# Patient Record
Sex: Male | Born: 1955 | Race: White | Hispanic: No | Marital: Married | Smoking: Former smoker
Health system: Southern US, Community
[De-identification: ages and names within clinical notes are randomized; demographics above are authoritative.]

## PROBLEM LIST (undated history)

## (undated) DIAGNOSIS — E785 Hyperlipidemia, unspecified: Secondary | ICD-10-CM

## (undated) DIAGNOSIS — K5730 Diverticulosis of large intestine without perforation or abscess without bleeding: Secondary | ICD-10-CM

## (undated) DIAGNOSIS — R252 Cramp and spasm: Secondary | ICD-10-CM

## (undated) HISTORY — PX: VASECTOMY: SHX75

## (undated) HISTORY — PX: COLOSTOMY: SHX63

## (undated) HISTORY — DX: Cramp and spasm: R25.2

## (undated) HISTORY — DX: Diverticulosis of large intestine without perforation or abscess without bleeding: K57.30

## (undated) HISTORY — DX: Hyperlipidemia, unspecified: E78.5

## (undated) HISTORY — PX: OTHER SURGICAL HISTORY: SHX169

---

## 2005-04-03 ENCOUNTER — Ambulatory Visit: Payer: Self-pay | Admitting: Internal Medicine

## 2005-04-04 ENCOUNTER — Ambulatory Visit: Payer: Self-pay | Admitting: Internal Medicine

## 2009-07-22 ENCOUNTER — Ambulatory Visit: Payer: Self-pay | Admitting: Internal Medicine

## 2009-08-04 ENCOUNTER — Ambulatory Visit: Payer: Self-pay | Admitting: Internal Medicine

## 2009-08-16 ENCOUNTER — Ambulatory Visit: Payer: Self-pay | Admitting: Internal Medicine

## 2009-08-17 LAB — CONVERTED CEMR LAB
AST: 29 units/L (ref 0–37)
Albumin: 4.3 g/dL (ref 3.5–5.2)
Alkaline Phosphatase: 55 units/L (ref 39–117)
Basophils Absolute: 0 10*3/uL (ref 0.0–0.1)
Bilirubin Urine: NEGATIVE
Bilirubin, Direct: 0.1 mg/dL (ref 0.0–0.3)
Calcium: 9.2 mg/dL (ref 8.4–10.5)
Eosinophils Absolute: 0.1 10*3/uL (ref 0.0–0.7)
Eosinophils Relative: 2.6 % (ref 0.0–5.0)
GFR calc non Af Amer: 74.49 mL/min (ref >60)
Glucose, Bld: 115 mg/dL — ABNORMAL HIGH (ref 70–99)
HCT: 40.8 % (ref 39.0–52.0)
HDL: 83.8 mg/dL (ref >39.00)
Hemoglobin, Urine: NEGATIVE
Lymphs Abs: 2.2 10*3/uL (ref 0.7–4.0)
MCHC: 34.9 g/dL (ref 30.0–36.0)
MCV: 92.3 fL (ref 78.0–100.0)
Monocytes Absolute: 0.5 10*3/uL (ref 0.1–1.0)
Neutrophils Relative %: 43.6 % (ref 43.0–77.0)
Nitrite: NEGATIVE
PSA: 0.76 ng/mL (ref 0.10–4.00)
Platelets: 237 10*3/uL (ref 150.0–400.0)
Potassium: 4.3 meq/L (ref 3.5–5.1)
RDW: 12.1 % (ref 11.5–14.6)
Sodium: 140 meq/L (ref 135–145)
Total Bilirubin: 1 mg/dL (ref 0.3–1.2)
Total Protein, Urine: NEGATIVE mg/dL
Triglycerides: 48 mg/dL (ref 0.0–149.0)
Urobilinogen, UA: 0.2 (ref 0.0–1.0)
VLDL: 9.6 mg/dL (ref 0.0–40.0)
WBC: 4.9 10*3/uL (ref 4.5–10.5)

## 2009-08-19 ENCOUNTER — Ambulatory Visit: Payer: Self-pay | Admitting: Internal Medicine

## 2009-08-19 DIAGNOSIS — R252 Cramp and spasm: Secondary | ICD-10-CM

## 2009-08-19 DIAGNOSIS — E785 Hyperlipidemia, unspecified: Secondary | ICD-10-CM | POA: Insufficient documentation

## 2009-08-19 DIAGNOSIS — K5730 Diverticulosis of large intestine without perforation or abscess without bleeding: Secondary | ICD-10-CM

## 2009-08-19 HISTORY — DX: Diverticulosis of large intestine without perforation or abscess without bleeding: K57.30

## 2009-08-19 HISTORY — DX: Cramp and spasm: R25.2

## 2009-08-19 HISTORY — DX: Hyperlipidemia, unspecified: E78.5

## 2011-11-30 ENCOUNTER — Telehealth: Payer: Self-pay

## 2011-11-30 DIAGNOSIS — Z1289 Encounter for screening for malignant neoplasm of other sites: Secondary | ICD-10-CM

## 2011-11-30 DIAGNOSIS — Z0000 Encounter for general adult medical examination without abnormal findings: Secondary | ICD-10-CM

## 2011-11-30 NOTE — Telephone Encounter (Signed)
Put order in for physical labs. 

## 2011-12-16 ENCOUNTER — Encounter: Payer: Self-pay | Admitting: Internal Medicine

## 2011-12-16 DIAGNOSIS — Z0000 Encounter for general adult medical examination without abnormal findings: Secondary | ICD-10-CM | POA: Insufficient documentation

## 2011-12-18 ENCOUNTER — Other Ambulatory Visit (INDEPENDENT_AMBULATORY_CARE_PROVIDER_SITE_OTHER): Payer: Self-pay

## 2011-12-18 DIAGNOSIS — Z1289 Encounter for screening for malignant neoplasm of other sites: Secondary | ICD-10-CM

## 2011-12-18 DIAGNOSIS — Z0000 Encounter for general adult medical examination without abnormal findings: Secondary | ICD-10-CM

## 2011-12-18 LAB — BASIC METABOLIC PANEL
BUN: 13 mg/dL (ref 6–23)
CO2: 29 mEq/L (ref 19–32)
Chloride: 99 mEq/L (ref 96–112)
Glucose, Bld: 98 mg/dL (ref 70–99)
Potassium: 4.3 mEq/L (ref 3.5–5.1)

## 2011-12-18 LAB — CBC WITH DIFFERENTIAL/PLATELET
Basophils Relative: 0.6 % (ref 0.0–3.0)
Eosinophils Relative: 3 % (ref 0.0–5.0)
Lymphocytes Relative: 48.3 % — ABNORMAL HIGH (ref 12.0–46.0)
MCV: 90.7 fl (ref 78.0–100.0)
Monocytes Absolute: 0.4 10*3/uL (ref 0.1–1.0)
Monocytes Relative: 13.1 % — ABNORMAL HIGH (ref 3.0–12.0)
Neutrophils Relative %: 35 % — ABNORMAL LOW (ref 43.0–77.0)
RBC: 4.33 Mil/uL (ref 4.22–5.81)
WBC: 2.9 10*3/uL — ABNORMAL LOW (ref 4.5–10.5)

## 2011-12-18 LAB — PSA: PSA: 0.91 ng/mL (ref 0.10–4.00)

## 2011-12-18 LAB — TSH: TSH: 1.3 u[IU]/mL (ref 0.35–5.50)

## 2011-12-18 LAB — HEPATIC FUNCTION PANEL
ALT: 31 U/L (ref 0–53)
AST: 38 U/L — ABNORMAL HIGH (ref 0–37)
Albumin: 3.8 g/dL (ref 3.5–5.2)
Total Protein: 6.4 g/dL (ref 6.0–8.3)

## 2011-12-18 LAB — URINALYSIS, ROUTINE W REFLEX MICROSCOPIC
Bilirubin Urine: NEGATIVE
Ketones, ur: NEGATIVE
Leukocytes, UA: NEGATIVE
Urine Glucose: NEGATIVE
Urobilinogen, UA: 0.2 (ref 0.0–1.0)

## 2011-12-18 LAB — LIPID PANEL
LDL Cholesterol: 88 mg/dL (ref 0–99)
Total CHOL/HDL Ratio: 3

## 2011-12-19 ENCOUNTER — Encounter: Payer: Self-pay | Admitting: Internal Medicine

## 2011-12-21 ENCOUNTER — Encounter: Payer: Self-pay | Admitting: Internal Medicine

## 2011-12-21 ENCOUNTER — Ambulatory Visit (INDEPENDENT_AMBULATORY_CARE_PROVIDER_SITE_OTHER): Payer: BC Managed Care – PPO | Admitting: Internal Medicine

## 2011-12-21 VITALS — BP 112/70 | HR 62 | Temp 97.4°F | Ht 69.0 in | Wt 159.1 lb

## 2011-12-21 DIAGNOSIS — R252 Cramp and spasm: Secondary | ICD-10-CM

## 2011-12-21 DIAGNOSIS — G4700 Insomnia, unspecified: Secondary | ICD-10-CM | POA: Insufficient documentation

## 2011-12-21 DIAGNOSIS — Z0000 Encounter for general adult medical examination without abnormal findings: Secondary | ICD-10-CM

## 2011-12-21 DIAGNOSIS — Z23 Encounter for immunization: Secondary | ICD-10-CM

## 2011-12-21 MED ORDER — ZALEPLON 5 MG PO CAPS: 5.0000 mg | ORAL_CAPSULE | Freq: Every day | ORAL | Status: AC

## 2011-12-21 MED ORDER — ASPIRIN 81 MG PO TBEC: 81.0000 mg | DELAYED_RELEASE_TABLET | Freq: Every day | ORAL | Status: AC

## 2011-12-21 MED ORDER — METHOCARBAMOL 500 MG PO TABS: 500.0000 mg | ORAL_TABLET | Freq: Four times a day (QID) | ORAL | Status: AC | PRN

## 2011-12-21 NOTE — Progress Notes (Signed)
Subjective:    Patient ID: Micheal Guerrero, male    DOB: 03/07/1956, 56 y.o.   MRN: [id number]  HPI Here for wellness and f/u;  Overall doing ok;  Pt denies CP, worsening SOB, DOE, wheezing, orthopnea, PND, worsening LE edema, palpitations, dizziness or syncope.  Pt denies neurological change such as new Headache, facial or extremity weakness.  Pt denies polydipsia, polyuria, or low sugar symptoms. Pt states overall good compliance with treatment and medications, good tolerability, and trying to follow lower cholesterol diet.  Pt denies worsening depressive symptoms, suicidal ideation or panic. No fever, wt loss, night sweats, loss of appetite, or other constitutional symptoms.  Pt states good ability with ADL's, low fall risk, home safety reviewed and adequate, no significant changes in hearing or vision, and occasionally active with exercise.  Overall under quite a bit stress with marriage, occas musclular cramps with running for exercise, and tends to wake up at 2am some nights, and wife's sonata 5 mg has helped (ambien caused hangover). Past Medical History  Diagnosis Date  . DIVERTICULOSIS, COLON 08/19/2009  . HYPERLIPIDEMIA 08/19/2009  . LEG CRAMPS 08/19/2009   Past Surgical History  Procedure Date  . S/p bakers cyst knee      @ age 72  . Vasectomy     reports that he has quit smoking. He does not have any smokeless tobacco history on file. He reports that he drinks alcohol. He reports that he uses illicit drugs. family history includes Dementia in his father. No Known Allergies No current outpatient prescriptions on file prior to visit.    Review of Systems Review of Systems  Constitutional: Negative for diaphoresis, activity change, appetite change and unexpected weight change.  HENT: Negative for hearing loss, ear pain, facial swelling, mouth sores and neck stiffness.   Eyes: Negative for pain, redness and visual disturbance.  Respiratory: Negative for shortness of breath and  wheezing.   Cardiovascular: Negative for chest pain and palpitations.  Gastrointestinal: Negative for diarrhea, blood in stool, abdominal distention and rectal pain.  Genitourinary: Negative for hematuria, flank pain and decreased urine volume.  Musculoskeletal: Negative for myalgias and joint swelling.  Skin: Negative for color change and wound.  Neurological: Negative for syncope and numbness.  Hematological: Negative for adenopathy.  Psychiatric/Behavioral: Negative for hallucinations, self-injury, decreased concentration and agitation.      Objective:   Physical Exam BP 112/70  Pulse 62  Temp(Src) 97.4 F (36.3 C) (Oral)  Ht 5\' 9"  (1.753 m)  Wt 159 lb 2 oz (72.179 kg)  BMI 23.50 kg/m2  SpO2 96%  Physical Exam  VS noted Constitutional: Pt is oriented to person, place, and time. Appears well-developed and well-nourished.  HENT:  Head: Normocephalic and atraumatic.  Right Ear: External ear normal.  Left Ear: External ear normal.  Nose: Nose normal.  Mouth/Throat: Oropharynx is clear and moist.  Eyes: Conjunctivae and EOM are normal. Pupils are equal, round, and reactive to light.  Neck: Normal range of motion. Neck supple. No JVD present. No tracheal deviation present.  Cardiovascular: Normal rate, regular rhythm, normal heart sounds and intact distal pulses.   Pulmonary/Chest: Effort normal and breath sounds normal.  Abdominal: Soft. Bowel sounds are normal. There is no tenderness.  Musculoskeletal: Normal range of motion. Exhibits no edema.  Lymphadenopathy:  Has no cervical adenopathy.  Neurological: Pt is alert and oriented to person, place, and time. Pt has normal reflexes. No cranial nerve deficit.  Skin: Skin is warm and dry. No rash noted.  Psychiatric:  Has  normal mood and affect. Behavior is normal.     Assessment & Plan:

## 2011-12-21 NOTE — Progress Notes (Signed)
Addended by: Scharlene Gloss B on: 12/21/2011 09:55 AM   Modules accepted: Orders

## 2011-12-21 NOTE — Assessment & Plan Note (Addendum)
Overall doing well, age appropriate education and counseling updated, referrals for preventative services and immunizations addressed, dietary and smoking counseling addressed, most recent labs and ECG reviewed.  I have personally reviewed and have noted: 1) the patient's medical and social history 2) The pt's use of alcohol, tobacco, and illicit drugs 3) The patient's current medications and supplements 4) Functional ability including ADL's, fall risk, home safety risk, hearing and visual impairment 5) Diet and physical activities 6) Evidence for depression or mood disorder 7) The patient's height, weight, and BMI have been recorded in the chart I have made referrals, and provided counseling and education based on review of the above Also with internat travel - asks for Hep A and B immnizations - to start the series today ECG reviewed as per emr

## 2011-12-21 NOTE — Patient Instructions (Addendum)
You had the Twinrx shot today (first shot for Hep A/Hep B) Please make Nurse Appt for Shot #2 in 2 months, and Shot #3 in 6 months You had the tetanus shot today Take all new medications as prescribed  Continue all other medications as before Please also take Aspirin 81 mg - 1 per day - COATED only - to reduce risk of stroke and heart disease You are otherwise up to date with prevention at this time Please return in 1 year for your yearly visit, or sooner if needed, with Lab testing done 3-5 days before

## 2012-02-19 ENCOUNTER — Ambulatory Visit (INDEPENDENT_AMBULATORY_CARE_PROVIDER_SITE_OTHER): Payer: BC Managed Care – PPO

## 2012-02-19 DIAGNOSIS — Z23 Encounter for immunization: Secondary | ICD-10-CM

## 2012-05-18 ENCOUNTER — Encounter: Payer: Self-pay | Admitting: Internal Medicine

## 2012-05-18 ENCOUNTER — Ambulatory Visit: Payer: BC Managed Care – PPO | Admitting: Internal Medicine

## 2012-05-18 VITALS — BP 106/68 | HR 91 | Temp 98.4°F | Resp 16

## 2012-05-18 DIAGNOSIS — T148XXA Other injury of unspecified body region, initial encounter: Secondary | ICD-10-CM

## 2012-05-18 DIAGNOSIS — W540XXA Bitten by dog, initial encounter: Secondary | ICD-10-CM

## 2012-05-18 DIAGNOSIS — S61409A Unspecified open wound of unspecified hand, initial encounter: Secondary | ICD-10-CM

## 2012-05-18 MED ORDER — AMOXICILLIN-POT CLAVULANATE 875-125 MG PO TABS: 1.0000 | ORAL_TABLET | Freq: Two times a day (BID) | ORAL | Status: AC

## 2012-05-18 NOTE — Progress Notes (Signed)
  Subjective:    Patient ID: Micheal Guerrero, male    DOB: Apr 19, 1956, 56 y.o.   MRN: [id number]  HPIHis dog bit him on the hand as he was trying to carry him away from the neighbors house Dog shots are all up to date bite over radial artery but history suggests no arterial bleeding    Review of Systems     Objective:   Physical Exam  L hand with bite wounds volar wrist and dorsum(2 small) Puncture over rad art but vascular/neuro intact and no pain with tendon stressors      Assessment & Plan:  Bite wound- Augmentin prophylaxis for 5 days Wound care Suture removal 7 d

## 2012-05-18 NOTE — Patient Instructions (Addendum)

## 2012-05-18 NOTE — Progress Notes (Signed)
@  [email or web address]  Patient ID: Micheal Guerrero MRN: [id number], DOB: October 14, 1956, 56 y.o. Date of Encounter: 05/18/2012, 3:04 PM   PROCEDURE NOTE: Verbal consent obtained. Sterile technique employed. Numbing: Local anesthesia obtained with 2% lidocaine plain  Cleansed with soap and water. Irrigated.  Wound explored, no deep structures involved, no foreign bodies.   Wound repaired with # 2 SI and #1 HM sutures with 4.0 ethilon Hemostasis obtained. Wound cleansed and dressed.  Wound care instructions including precautions covered with patient. Handout given.  Anticipate suture removal in 7-10 days  Rhoderick Moody, PA-C 05/18/2012 3:04 PM

## 2012-05-27 ENCOUNTER — Ambulatory Visit (INDEPENDENT_AMBULATORY_CARE_PROVIDER_SITE_OTHER): Payer: BC Managed Care – PPO | Admitting: Physician Assistant

## 2012-05-27 VITALS — BP 118/74 | HR 51 | Resp 16

## 2012-05-27 DIAGNOSIS — Z4802 Encounter for removal of sutures: Secondary | ICD-10-CM

## 2012-05-27 NOTE — Progress Notes (Signed)
   Patient ID: Micheal Guerrero MRN: [id number], DOB: Dec 23, 1955 56 y.o. Date of Encounter: 05/27/2012, 12:20 PM  Primary Physician: Oliver Barre, MD  Chief Complaint: Suture removal    See note from 05/18/12  HPI: 56 y.o. y/o male with injury to volar medial left wrist secondary to dog bite. Tolerating Augmentin. Here for suture removal s/p placement on 05/18/12 Doing well No issues/complaints Afebrile/ No chills No erythema No pain Able to move without difficulty Normal sensation  Past Medical History  Diagnosis Date  . DIVERTICULOSIS, COLON 08/19/2009  . HYPERLIPIDEMIA 08/19/2009  . LEG CRAMPS 08/19/2009     Home Meds: Prior to Admission medications   Medication Sig Start Date End Date Taking? Authorizing Provider  amoxicillin-clavulanate (AUGMENTIN) 875-125 MG per tablet Take 1 tablet by mouth 2 (two) times daily. With food 05/18/12 05/28/12  Tonye Pearson, MD  aspirin 81 MG EC tablet Take 1 tablet (81 mg total) by mouth daily. Swallow whole. 12/21/11 12/20/12  Corwin Levins, MD    Allergies: No Known Allergies  Physical Exam: Blood pressure 118/74, pulse 51, resp. rate 16., There is no height or weight on file to calculate BMI. General: Well developed, well nourished, in no acute distress. Head: Normocephalic, atraumatic, sclera non-icteric, no xanthomas, nares are without discharge.  Neck: Supple. Lungs: Breathing is unlabored. Heart: Normal rate. Msk:  Strength and tone appear normal for age. Wound: Wound well healed without erythema, swelling, or tenderness to palpation. FROM and 5/5 strength with normal sensation throughout including 2 point discrimination Skin: See above, otherwise dry without rash or erythema. Extremities: No clubbing or cyanosis. No edema. Neuro: Alert and oriented X 3. Moves all extremities spontaneously.  Psych:  Responds to questions appropriately with a normal affect.   PROCEDURE: Verbal consent obtained. 2 simple interrupted and 1  horizontal mattress sutures removed without difficulty.  Assessment and Plan: 56 y.o. y/o male here for suture removal for wound described above. -No signs of infection -Sutures removed per above -Wound resolved -Finish Augmentin -RTC prn  Signed, Eula Listen, PA-C 05/27/2012 12:20 PM

## 2012-06-19 ENCOUNTER — Ambulatory Visit: Payer: BC Managed Care – PPO

## 2012-07-09 ENCOUNTER — Ambulatory Visit (INDEPENDENT_AMBULATORY_CARE_PROVIDER_SITE_OTHER): Payer: BC Managed Care – PPO

## 2012-07-09 DIAGNOSIS — Z23 Encounter for immunization: Secondary | ICD-10-CM

## 2015-05-07 ENCOUNTER — Encounter: Payer: Self-pay | Admitting: Internal Medicine

## 2015-07-15 ENCOUNTER — Encounter: Payer: Self-pay | Admitting: Internal Medicine

## 2015-07-15 ENCOUNTER — Other Ambulatory Visit (INDEPENDENT_AMBULATORY_CARE_PROVIDER_SITE_OTHER): Payer: BLUE CROSS/BLUE SHIELD

## 2015-07-15 ENCOUNTER — Ambulatory Visit (INDEPENDENT_AMBULATORY_CARE_PROVIDER_SITE_OTHER): Payer: BLUE CROSS/BLUE SHIELD | Admitting: Internal Medicine

## 2015-07-15 VITALS — BP 118/76 | HR 71 | Temp 99.4°F | Ht 69.0 in | Wt 165.0 lb

## 2015-07-15 DIAGNOSIS — M79671 Pain in right foot: Secondary | ICD-10-CM

## 2015-07-15 DIAGNOSIS — M79672 Pain in left foot: Secondary | ICD-10-CM | POA: Diagnosis not present

## 2015-07-15 DIAGNOSIS — Z0000 Encounter for general adult medical examination without abnormal findings: Secondary | ICD-10-CM

## 2015-07-15 LAB — URINALYSIS, ROUTINE W REFLEX MICROSCOPIC
Bilirubin Urine: NEGATIVE
Ketones, ur: NEGATIVE
Leukocytes, UA: NEGATIVE
NITRITE: NEGATIVE
Specific Gravity, Urine: 1.025 (ref 1.000–1.030)
Total Protein, Urine: NEGATIVE
URINE GLUCOSE: NEGATIVE
UROBILINOGEN UA: 0.2 (ref 0.0–1.0)
WBC, UA: NONE SEEN (ref 0–?)
pH: 5.5 (ref 5.0–8.0)

## 2015-07-15 LAB — CBC WITH DIFFERENTIAL/PLATELET
Basophils Absolute: 0 10*3/uL (ref 0.0–0.1)
Basophils Relative: 0.6 % (ref 0.0–3.0)
EOS PCT: 1.9 % (ref 0.0–5.0)
Eosinophils Absolute: 0.1 10*3/uL (ref 0.0–0.7)
HCT: 41 % (ref 39.0–52.0)
HEMOGLOBIN: 14.1 g/dL (ref 13.0–17.0)
LYMPHS PCT: 24.5 % (ref 12.0–46.0)
Lymphs Abs: 1.5 10*3/uL (ref 0.7–4.0)
MCHC: 34.4 g/dL (ref 30.0–36.0)
MCV: 91.8 fl (ref 78.0–100.0)
Monocytes Absolute: 0.6 10*3/uL (ref 0.1–1.0)
Monocytes Relative: 10.2 % (ref 3.0–12.0)
Neutro Abs: 3.8 10*3/uL (ref 1.4–7.7)
Neutrophils Relative %: 62.8 % (ref 43.0–77.0)
Platelets: 219 10*3/uL (ref 150.0–400.0)
RBC: 4.46 Mil/uL (ref 4.22–5.81)
RDW: 12.5 % (ref 11.5–15.5)
WBC: 6 10*3/uL (ref 4.0–10.5)

## 2015-07-15 LAB — LIPID PANEL
CHOL/HDL RATIO: 3
CHOLESTEROL: 202 mg/dL — AB (ref 0–200)
HDL: 63.7 mg/dL (ref >39.00)
LDL CALC: 123 mg/dL — AB (ref 0–99)
NonHDL: 138.14
TRIGLYCERIDES: 74 mg/dL (ref 0.0–149.0)
VLDL: 14.8 mg/dL (ref 0.0–40.0)

## 2015-07-15 LAB — BASIC METABOLIC PANEL
BUN: 16 mg/dL (ref 6–23)
CO2: 29 meq/L (ref 19–32)
Calcium: 9.3 mg/dL (ref 8.4–10.5)
Chloride: 102 mEq/L (ref 96–112)
Creatinine, Ser: 0.98 mg/dL (ref 0.40–1.50)
GFR: 83.3 mL/min (ref >60.00)
GLUCOSE: 100 mg/dL — AB (ref 70–99)
POTASSIUM: 4.3 meq/L (ref 3.5–5.1)
SODIUM: 139 meq/L (ref 135–145)

## 2015-07-15 LAB — HEPATIC FUNCTION PANEL
ALK PHOS: 62 U/L (ref 39–117)
ALT: 21 U/L (ref 0–53)
AST: 21 U/L (ref 0–37)
Albumin: 4.3 g/dL (ref 3.5–5.2)
BILIRUBIN TOTAL: 0.7 mg/dL (ref 0.2–1.2)
Bilirubin, Direct: 0.1 mg/dL (ref 0.0–0.3)
Total Protein: 7.1 g/dL (ref 6.0–8.3)

## 2015-07-15 LAB — PSA: PSA: 1.18 ng/mL (ref 0.10–4.00)

## 2015-07-15 LAB — TSH: TSH: 1.26 u[IU]/mL (ref 0.35–4.50)

## 2015-07-15 MED ORDER — ASPIRIN EC 81 MG PO TBEC: 81.0000 mg | DELAYED_RELEASE_TABLET | Freq: Every day | ORAL | Status: DC

## 2015-07-15 MED ORDER — MELOXICAM 15 MG PO TABS: 15.0000 mg | ORAL_TABLET | Freq: Every day | ORAL | Status: DC

## 2015-07-15 NOTE — Progress Notes (Signed)
Pre visit review using our clinic review tool, if applicable. No additional management support is needed unless otherwise documented below in the visit note. 

## 2015-07-15 NOTE — Assessment & Plan Note (Signed)
[facility] for nsaid prn otc, but also to see Dr Smith/sport med -

## 2015-07-15 NOTE — Patient Instructions (Addendum)
Your EKG was OK today  Please start Aspirin (enteric coated only) 81 mg per day (OTC) for general risk reduction for heart disease prevention  Please take all new medication as prescribed - the anti-inflammatory if needed  You will be contacted regarding the referral for: Dr Smith/Sports Medicine in this office  Please continue all other medications as before, and refills have been done if requested.  Please have the pharmacy call with any other refills you may need.  Please continue your efforts at being more active, low cholesterol diet, and weight control.  You are otherwise up to date with prevention measures today.  Please keep your appointments with your specialists as you may have planned  Please go to the LAB in the Basement (turn left off the elevator) for the tests to be done today  You will be contacted by phone if any changes need to be made immediately.  Otherwise, you will receive a letter about your results with an explanation, but please check with MyChart first.  Please remember to sign up for MyChart if you have not done so, as this will be important to you in the future with finding out test results, communicating by private email, and scheduling acute appointments online when needed.  Please return in 1 year for your yearly visit, or sooner if needed, with Lab testing done 3-5 days before

## 2015-07-15 NOTE — Assessment & Plan Note (Addendum)

## 2015-07-15 NOTE — Addendum Note (Signed)
Addended by: Biagio Borg on: 07/15/2015 12:40 PM   Modules accepted: Miquel Dunn

## 2015-07-15 NOTE — Progress Notes (Signed)
Subjective:    Patient ID: Micheal Guerrero, male    DOB: 04/21/1956, 59 y.o.   MRN: [id number]  HPI Here for wellness and f/u;  Overall doing ok;  Pt denies Chest pain, worsening SOB, DOE, wheezing, orthopnea, PND, worsening LE edema, palpitations, dizziness or syncope.  Pt denies neurological change such as new headache, facial or extremity weakness.  Pt denies polydipsia, polyuria, or low sugar symptoms. Pt states overall good compliance with treatment and medications, good tolerability, and has been trying to follow appropriate diet.  Pt denies worsening depressive symptoms, suicidal ideation or panic. No fever, night sweats, wt loss, loss of appetite, or other constitutional symptoms.  Pt states good ability with ADL's, has low fall risk, home safety reviewed and adequate, no other significant changes in hearing or vision, and only occasionally active with exercise.  Does have bilat distal foot pad pain with cycling Past Medical History  Diagnosis Date  . DIVERTICULOSIS, COLON 08/19/2009  . HYPERLIPIDEMIA 08/19/2009  . LEG CRAMPS 08/19/2009   Past Surgical History  Procedure Laterality Date  . S/p bakers cyst knee       @ age 52  . Vasectomy      reports that he has quit smoking. He does not have any smokeless tobacco history on file. He reports that he drinks alcohol. He reports that he uses illicit drugs. family history includes Dementia in his father. No Known Allergies No current outpatient prescriptions on file prior to visit.   No current facility-administered medications on file prior to visit.   Review of Systems Constitutional: Negative for increased diaphoresis, other activity, appetite or siginficant weight change other than noted HENT: Negative for worsening hearing loss, ear pain, facial swelling, mouth sores and neck stiffness.   Eyes: Negative for other worsening pain, redness or visual disturbance.  Respiratory: Negative for shortness of breath and wheezing    Cardiovascular: Negative for chest pain and palpitations.  Gastrointestinal: Negative for diarrhea, blood in stool, abdominal distention or other pain Genitourinary: Negative for hematuria, flank pain or change in urine volume.  Musculoskeletal: Negative for myalgias or other joint complaints.  Skin: Negative for color change and wound or drainage.  Neurological: Negative for syncope and numbness. other than noted Hematological: Negative for adenopathy. or other swelling Psychiatric/Behavioral: Negative for hallucinations, SI, self-injury, decreased concentration or other worsening agitation.      Objective:   Physical Exam BP 118/76 mmHg  Pulse 71  Temp(Src) 99.4 F (37.4 C) (Oral)  Ht 5\' 9"  (1.753 m)  Wt 165 lb (74.844 kg)  BMI 24.36 kg/m2  SpO2 97% VS noted,  Constitutional: Pt is oriented to person, place, and time. Appears well-developed and well-nourished, in no significant distress Head: Normocephalic and atraumatic.  Right Ear: External ear normal.  Left Ear: External ear normal.  Nose: Nose normal.  Mouth/Throat: Oropharynx is clear and moist.  Eyes: Conjunctivae and EOM are normal. Pupils are equal, round, and reactive to light.  Neck: Normal range of motion. Neck supple. No JVD present. No tracheal deviation present or significant neck LA or mass Cardiovascular: Normal rate, regular rhythm, normal heart sounds and intact distal pulses.   Pulmonary/Chest: Effort normal and breath sounds without rales or wheezing  Abdominal: Soft. Bowel sounds are normal. NT. No HSM  Musculoskeletal: Normal range of motion. Exhibits no edema.  Lymphadenopathy:  Has no cervical adenopathy.  Neurological: Pt is alert and oriented to person, place, and time. Pt has normal reflexes. No cranial nerve deficit. Motor  grossly intact Skin: Skin is warm and dry. No rash noted.  Bilat feet with tender sesamoid areas and callous Psychiatric:  Has normal mood and affect. Behavior is normal.      Assessment & Plan:

## 2015-07-19 ENCOUNTER — Ambulatory Visit (INDEPENDENT_AMBULATORY_CARE_PROVIDER_SITE_OTHER): Payer: BLUE CROSS/BLUE SHIELD | Admitting: Family Medicine

## 2015-07-19 ENCOUNTER — Other Ambulatory Visit (INDEPENDENT_AMBULATORY_CARE_PROVIDER_SITE_OTHER): Payer: BLUE CROSS/BLUE SHIELD

## 2015-07-19 ENCOUNTER — Encounter: Payer: Self-pay | Admitting: Family Medicine

## 2015-07-19 VITALS — BP 130/70 | HR 53 | Ht 69.0 in | Wt 164.0 lb

## 2015-07-19 DIAGNOSIS — M778 Other enthesopathies, not elsewhere classified: Secondary | ICD-10-CM | POA: Insufficient documentation

## 2015-07-19 DIAGNOSIS — M779 Enthesopathy, unspecified: Secondary | ICD-10-CM

## 2015-07-19 DIAGNOSIS — M79672 Pain in left foot: Secondary | ICD-10-CM

## 2015-07-19 DIAGNOSIS — M216X2 Other acquired deformities of left foot: Secondary | ICD-10-CM

## 2015-07-19 DIAGNOSIS — M7752 Other enthesopathy of left foot: Secondary | ICD-10-CM | POA: Diagnosis not present

## 2015-07-19 NOTE — Progress Notes (Signed)
Pre visit review using our clinic review tool, if applicable. No additional management support is needed unless otherwise documented below in the visit note. 

## 2015-07-19 NOTE — Patient Instructions (Addendum)
Good to see you.  Ice bath for 10-20 minutes after activity Exercises 3 times a week.  Avoid being barefoot.  Vitamin D 2000 IU daily pennsaid pinkie amount topically 2 times daily as needed.  Spenco orthotics look for total support can try at [employer] the metatarsal pad is what is important.  Skip the middle eye when lacing shoes.  See me again in 3-4 weeks.

## 2015-07-19 NOTE — Assessment & Plan Note (Signed)
Patient's loss of the transverse arch is causing patient to have more of a capsulitis of the toes. This in turn makes patient's numbness occur which then causes patient to change his gait causing a sesamoiditis. We discussed proper shoe wear, over-the-counter orthotics, different lacing, topical anti-inflammatory's, icing protocol, and diet changes. Patient will try to make these changes and come back and see me in 3 weeks. Continuing to have discomfort we may need to consider further imaging including an x-ray as well as potential injection. We'll discuss at follow-up. Patient does have a past medical history significant for a Baker cyst at age 31 and we may want to consider workup for autoimmune with a capsulitis if no significant improvement.

## 2015-07-19 NOTE — Progress Notes (Signed)
[facility] [place], [place] [address] Phone: [phone] Subjective:    I'm seeing this patient by the request  of:  Micheal Cower, MD   CC: left foot numbness with running.    CLE:[id number] PARRIS Micheal Guerrero is a 59 y.o. male coming in with complaint of left foot with numbness after running 8 miles or more.  Worse with trail running.  No injury.  No significant pain. Resolves with loose shoes.  No swelling.  Mild radiation of the feeling from lateral toes to large toe.the severity of 5 out of 10. Patient tries to remain active but has difficult to secondary to this. Denies any nighttime awakenings.  Past Medical History  Diagnosis Date  . DIVERTICULOSIS, COLON 08/19/2009  . HYPERLIPIDEMIA 08/19/2009  . LEG CRAMPS 08/19/2009   Past Surgical History  Procedure Laterality Date  . S/p bakers cyst knee       @ age 45  . Vasectomy     Social History  Substance Use Topics  . Smoking status: Former Research scientist (life sciences)  . Smokeless tobacco: None  . Alcohol Use: Yes     Comment: social   No Known Allergies        Family History  Problem Relation Age of Onset  . Dementia Father      Past medical history, social, surgical and family history all reviewed in electronic medical record.   Review of Systems: No headache, visual changes, nausea, vomiting, diarrhea, constipation, dizziness, abdominal pain, skin rash, fevers, chills, night sweats, weight loss, swollen lymph nodes, body aches, joint swelling, muscle aches, chest pain, shortness of breath, mood changes.   Objective Blood pressure 130/70, pulse 53, height 5\' 9"  (1.753 m), weight 164 lb (74.39 kg), SpO2 95 %.  General: No apparent distress alert and oriented x3 mood and affect normal, dressed appropriately.  HEENT: Pupils equal, extraocular movements intact  Respiratory: Patient's speak in full sentences and does not appear short of breath  Cardiovascular: No lower extremity edema, non  tender, no erythema  Skin: Warm dry intact with no signs of infection or rash on extremities or on axial skeleton.  Abdomen: Soft nontender  Neuro: Cranial nerves II through XII are intact, neurovascularly intact in all extremities with 2+ DTRs and 2+ pulses.  Lymph: No lymphadenopathy of posterior or anterior cervical chain or axillae bilaterally.  Gait normal with good balance and coordination.  MSK:  Non tender with full range of motion and good stability and symmetric strength and tone of shoulders, elbows, wrist, hip, knee and ankles bilaterally.  Foot exam showspatient does have a little bit of a pes cavus bilaterally left greater than right. Patient does have a Morton's toe bilaterally left greater than right. Mild hammering between the first and second toes. Callus formation noted over the plantar aspect of 2 through 4. Neutral hindfoot. Nontender on exam negative squeeze test neurovascularly intact distally.  Limited musculoskeletal ultrasound was performed and interpreted by Hulan Saas, M  Limited ultrasound the patient's forefoot shows the patient does have capsulitis between toes first, second and third MTP joints. Mild to moderate in amount of swelling. No neuroma noted. Patient though does have signs of hypoechoic changes on the plantar aspect over the sesamoid bone as well as intermetatarsal bursitis. Impression: Intermetatarsal bursitis and mild sesamoiditis with capsulitis of first through 3 MTP joints.  Procedure note [id number]; 15 minutes spent for Therapeutic exercises as stated in above notes.  This included exercises focusing on stretching,  strengthening, with significant focus on eccentric aspects.  Exercises for the foot include:  Stretches to help lengthen the lower leg and plantar fascia areas Theraband exercises for the lower leg and ankle to help strengthen the surrounding area- dorsiflexion, plantarflexion, inversion, eversion Massage rolling on the plantar surface of  the foot with a frozen bottle, tennis ball or golf ball Towel or marble pick-ups to strengthen the plantar surface of the foot Weight bearing exercises to increase balance and overall stability Proper technique shown and discussed handout in great detail with ATC.  All questions were discussed and answered.      Impression and Recommendations:     This case required medical decision making of moderate complexity.

## 2015-08-16 ENCOUNTER — Ambulatory Visit (INDEPENDENT_AMBULATORY_CARE_PROVIDER_SITE_OTHER): Payer: BLUE CROSS/BLUE SHIELD | Admitting: Family Medicine

## 2015-08-16 ENCOUNTER — Encounter: Payer: Self-pay | Admitting: Family Medicine

## 2015-08-16 VITALS — BP 122/70 | HR 61 | Ht 69.0 in | Wt 167.0 lb

## 2015-08-16 DIAGNOSIS — M7752 Other enthesopathy of left foot: Secondary | ICD-10-CM

## 2015-08-16 DIAGNOSIS — M778 Other enthesopathies, not elsewhere classified: Secondary | ICD-10-CM

## 2015-08-16 DIAGNOSIS — M779 Enthesopathy, unspecified: Principal | ICD-10-CM

## 2015-08-16 NOTE — Progress Notes (Signed)
Pre visit review using our clinic review tool, if applicable. No additional management support is needed unless otherwise documented below in the visit note. 

## 2015-08-16 NOTE — Assessment & Plan Note (Signed)
Patient is doing much better with conservative therapy. Encourage him to continue to wear good shoes as well as the orthotics that I think be beneficial. We discussed using icing and the topical anti-inflammatory or oral anti-inflammatory when needed. Lungs patient does well he can follow-up on an as-needed basis.

## 2015-08-16 NOTE — Progress Notes (Signed)
  [name] [address] [place], [place] [address] Phone: [phone] Subjective:    I'm seeing this patient by the request  of:  Cathlean Cower, MD   CC: left foot numbness with running.    [id number] Micheal Guerrero is a 59 y.o. male coming in with complaint of left foot with numbness after running 8 miles or more.  Patient did get over-the-counter orthotics, icing protocol, meloxicam, and did decrease some of his running. Patient states since then he is feeling 80-85% better. Denies any new symptoms. States that he is very happy with the results.  Past Medical History  Diagnosis Date  . DIVERTICULOSIS, COLON 08/19/2009  . HYPERLIPIDEMIA 08/19/2009  . LEG CRAMPS 08/19/2009   Past Surgical History  Procedure Laterality Date  . S/p bakers cyst knee       @ age 70  . Vasectomy     Social History  Substance Use Topics  . Smoking status: Former Research scientist (life sciences)  . Smokeless tobacco: None  . Alcohol Use: Yes     Comment: social   No Known Allergies        Family History  Problem Relation Age of Onset  . Dementia Father      Past medical history, social, surgical and family history all reviewed in electronic medical record.   Review of Systems: No headache, visual changes, nausea, vomiting, diarrhea, constipation, dizziness, abdominal pain, skin rash, fevers, chills, night sweats, weight loss, swollen lymph nodes, body aches, joint swelling, muscle aches, chest pain, shortness of breath, mood changes.   Objective Blood pressure 122/70, pulse 61, height 5\' 9"  (1.753 m), weight 167 lb (75.751 kg), SpO2 93 %.  General: No apparent distress alert and oriented x3 mood and affect normal, dressed appropriately.  HEENT: Pupils equal, extraocular movements intact  Respiratory: Patient's speak in full sentences and does not appear short of breath  Cardiovascular: No lower extremity edema, non tender, no erythema  Skin: Warm dry intact with no signs of  infection or rash on extremities or on axial skeleton.  Abdomen: Soft nontender  Neuro: Cranial nerves II through XII are intact, neurovascularly intact in all extremities with 2+ DTRs and 2+ pulses.  Lymph: No lymphadenopathy of posterior or anterior cervical chain or axillae bilaterally.  Gait normal with good balance and coordination.  MSK:  Non tender with full range of motion and good stability and symmetric strength and tone of shoulders, elbows, wrist, hip, knee and ankles bilaterally.  Foot exam showspatient does have a little bit of a pes cavus bilaterally left greater than right. Patient does have a Morton's toe bilaterally left greater than right. Mild hammering between the first and second toes. Callus is resolved Neutral hindfoot. Nontender on exam negative squeeze test neurovascularly intact distally.      Impression and Recommendations:     This case required medical decision making of moderate complexity.

## 2015-08-16 NOTE — Patient Instructions (Signed)
Verbal instructions given

## 2015-10-19 ENCOUNTER — Ambulatory Visit: Payer: BLUE CROSS/BLUE SHIELD | Admitting: Family Medicine

## 2016-03-02 ENCOUNTER — Encounter (INDEPENDENT_AMBULATORY_CARE_PROVIDER_SITE_OTHER): Payer: Self-pay

## 2016-03-16 ENCOUNTER — Encounter: Payer: Self-pay | Admitting: Internal Medicine

## 2017-04-24 ENCOUNTER — Other Ambulatory Visit: Payer: Self-pay | Admitting: Internal Medicine

## 2017-04-24 ENCOUNTER — Encounter: Payer: Self-pay | Admitting: Internal Medicine

## 2017-04-24 ENCOUNTER — Ambulatory Visit (INDEPENDENT_AMBULATORY_CARE_PROVIDER_SITE_OTHER): Payer: BLUE CROSS/BLUE SHIELD | Admitting: Internal Medicine

## 2017-04-24 ENCOUNTER — Other Ambulatory Visit (INDEPENDENT_AMBULATORY_CARE_PROVIDER_SITE_OTHER): Payer: BLUE CROSS/BLUE SHIELD

## 2017-04-24 VITALS — BP 150/100 | HR 63 | Ht 69.0 in | Wt 172.0 lb

## 2017-04-24 DIAGNOSIS — Z0000 Encounter for general adult medical examination without abnormal findings: Secondary | ICD-10-CM

## 2017-04-24 DIAGNOSIS — Z0001 Encounter for general adult medical examination with abnormal findings: Secondary | ICD-10-CM

## 2017-04-24 DIAGNOSIS — I10 Essential (primary) hypertension: Secondary | ICD-10-CM

## 2017-04-24 DIAGNOSIS — E785 Hyperlipidemia, unspecified: Secondary | ICD-10-CM

## 2017-04-24 LAB — BASIC METABOLIC PANEL
BUN: 16 mg/dL (ref 6–23)
CO2: 29 mEq/L (ref 19–32)
Calcium: 9.6 mg/dL (ref 8.4–10.5)
Chloride: 101 mEq/L (ref 96–112)
Creatinine, Ser: 0.98 mg/dL (ref 0.40–1.50)
GFR: 82.8 mL/min (ref >60.00)
GLUCOSE: 103 mg/dL — AB (ref 70–99)
POTASSIUM: 4.3 meq/L (ref 3.5–5.1)
SODIUM: 139 meq/L (ref 135–145)

## 2017-04-24 LAB — HEPATIC FUNCTION PANEL
ALBUMIN: 4.8 g/dL (ref 3.5–5.2)
ALK PHOS: 56 U/L (ref 39–117)
ALT: 31 U/L (ref 0–53)
AST: 24 U/L (ref 0–37)
Bilirubin, Direct: 0.1 mg/dL (ref 0.0–0.3)
Total Bilirubin: 0.8 mg/dL (ref 0.2–1.2)
Total Protein: 7.4 g/dL (ref 6.0–8.3)

## 2017-04-24 LAB — CBC WITH DIFFERENTIAL/PLATELET
Basophils Absolute: 0.1 10*3/uL (ref 0.0–0.1)
Basophils Relative: 0.9 % (ref 0.0–3.0)
Eosinophils Absolute: 0 10*3/uL (ref 0.0–0.7)
Eosinophils Relative: 0.9 % (ref 0.0–5.0)
HCT: 42.1 % (ref 39.0–52.0)
Hemoglobin: 14.7 g/dL (ref 13.0–17.0)
Lymphocytes Relative: 37.6 % (ref 12.0–46.0)
Lymphs Abs: 2 10*3/uL (ref 0.7–4.0)
MCHC: 34.8 g/dL (ref 30.0–36.0)
MCV: 91.4 fl (ref 78.0–100.0)
Monocytes Absolute: 0.4 10*3/uL (ref 0.1–1.0)
Monocytes Relative: 7.7 % (ref 3.0–12.0)
Neutro Abs: 2.9 10*3/uL (ref 1.4–7.7)
Neutrophils Relative %: 52.9 % (ref 43.0–77.0)
Platelets: 274 10*3/uL (ref 150.0–400.0)
RBC: 4.61 Mil/uL (ref 4.22–5.81)
RDW: 13.1 % (ref 11.5–15.5)
WBC: 5.4 10*3/uL (ref 4.0–10.5)

## 2017-04-24 LAB — URINALYSIS, ROUTINE W REFLEX MICROSCOPIC
Bilirubin Urine: NEGATIVE
Hgb urine dipstick: NEGATIVE
Ketones, ur: NEGATIVE
Leukocytes, UA: NEGATIVE
Nitrite: NEGATIVE
RBC / HPF: NONE SEEN
Specific Gravity, Urine: 1.02
Total Protein, Urine: NEGATIVE
Urine Glucose: NEGATIVE
Urobilinogen, UA: 0.2
WBC, UA: NONE SEEN
pH: 6 (ref 5.0–8.0)

## 2017-04-24 LAB — PSA: PSA: 3.15 ng/mL (ref 0.10–4.00)

## 2017-04-24 LAB — LIPID PANEL
CHOLESTEROL: 254 mg/dL — AB (ref 0–200)
HDL: 71 mg/dL (ref >39.00)
LDL Cholesterol: 162 mg/dL — ABNORMAL HIGH (ref 0–99)
NonHDL: 182.78
TRIGLYCERIDES: 103 mg/dL (ref 0.0–149.0)
Total CHOL/HDL Ratio: 4
VLDL: 20.6 mg/dL (ref 0.0–40.0)

## 2017-04-24 LAB — TSH: TSH: 1.58 u[IU]/mL (ref 0.35–4.50)

## 2017-04-24 MED ORDER — ZOSTER VAC RECOMB ADJUVANTED 50 MCG/0.5ML IM SUSR: 0.5000 mL | Freq: Once | INTRAMUSCULAR | 1 refills | Status: AC

## 2017-04-24 NOTE — Progress Notes (Signed)
Subjective:    Patient ID: Micheal Guerrero, male    DOB: 1956/03/20, 61 y.o.   MRN: [id number]  HPI  Here for wellness and f/u;  Overall doing ok;  Pt denies Chest pain, worsening SOB, DOE, wheezing, orthopnea, PND, worsening LE edema, palpitations, dizziness or syncope.  Pt denies neurological change such as new headache, facial or extremity weakness.  Pt denies polydipsia, polyuria, or low sugar symptoms. Pt states overall good compliance with treatment and medications, good tolerability, and has been trying to follow appropriate diet.  Pt denies worsening depressive symptoms, suicidal ideation or panic. No fever, night sweats, wt loss, loss of appetite, or other constitutional symptoms.  Pt states good ability with ADL's, has low fall risk, home safety reviewed and adequate, no other significant changes in hearing or vision, and only occasionally active with exercise in last 6 mo with some wt gain, though before has done frequent running.  Bp normal at drug store 6 mo ago, and states will check BP more often at home.  BP Readings from Last 3 Encounters:  04/24/17 (!) 150/100  08/16/15 122/70  07/19/15 130/70   Wt Readings from Last 3 Encounters:  04/24/17 172 lb (78 kg)  08/16/15 167 lb (75.8 kg)  07/19/15 164 lb (74.4 kg)  Having some marital discord, has been to couples counselig, so more stress recently. Asks for counseling for personal.   Past Medical History:  Diagnosis Date  . DIVERTICULOSIS, COLON 08/19/2009  . HYPERLIPIDEMIA 08/19/2009  . LEG CRAMPS 08/19/2009   Past Surgical History:  Procedure Laterality Date  . s/p bakers cyst knee      @ age 23  . VASECTOMY      reports that he has quit smoking. He has never used smokeless tobacco. He reports that he drinks alcohol. He reports that he uses drugs. family history includes Dementia in his father. No Known Allergies No current outpatient prescriptions on file prior to visit.   No current facility-administered  medications on file prior to visit.     Review of Systems Constitutional: Negative for other unusual diaphoresis, sweats, appetite or weight changes HENT: Negative for other worsening hearing loss, ear pain, facial swelling, mouth sores or neck stiffness.   Eyes: Negative for other worsening pain, redness or other visual disturbance.  Respiratory: Negative for other stridor or swelling Cardiovascular: Negative for other palpitations or other chest pain  Gastrointestinal: Negative for worsening diarrhea or loose stools, blood in stool, distention or other pain Genitourinary: Negative for hematuria, flank pain or other change in urine volume.  Musculoskeletal: Negative for myalgias or other joint swelling.  Skin: Negative for other color change, or other wound or worsening drainage.  Neurological: Negative for other syncope or numbness. Hematological: Negative for other adenopathy or swelling Psychiatric/Behavioral: Negative for hallucinations, other worsening agitation, SI, self-injury, or new decreased concentration All other system neg per pt    Objective:   Physical Exam BP (!) 150/100   Pulse 63   Ht 5\' 9"  (1.753 m)   Wt 172 lb (78 kg)   SpO2 98%   BMI 25.40 kg/m  VS noted,  Constitutional: Pt is oriented to person, place, and time. Appears well-developed and well-nourished, in no significant distress and comfortable Head: Normocephalic and atraumatic  Eyes: Conjunctivae and EOM are normal. Pupils are equal, round, and reactive to light Right Ear: External ear normal without discharge Left Ear: External ear normal without discharge Nose: Nose without discharge or deformity Mouth/Throat: Oropharynx is without  other ulcerations and moist  Neck: Normal range of motion. Neck supple. No JVD present. No tracheal deviation present or significant neck LA or mass Cardiovascular: Normal rate, regular rhythm, normal heart sounds and intact distal pulses.   Pulmonary/Chest: WOB normal and  breath sounds without rales or wheezing  Abdominal: Soft. Bowel sounds are normal. NT. No HSM  Musculoskeletal: Normal range of motion. Exhibits no edema Lymphadenopathy: Has no other cervical adenopathy.  Neurological: Pt is alert and oriented to person, place, and time. Pt has normal reflexes. No cranial nerve deficit. Motor grossly intact, Gait intact Skin: Skin is warm and dry. No rash noted or new ulcerations Psychiatric:  Has normal mood and affect. Behavior is normal without agitation No other exam findings  Lab Results  Component Value Date   WBC 6.0 07/15/2015   HGB 14.1 07/15/2015   HCT 41.0 07/15/2015   PLT 219.0 07/15/2015   GLUCOSE 100 (H) 07/15/2015   CHOL 202 (H) 07/15/2015   TRIG 74.0 07/15/2015   HDL 63.70 07/15/2015   LDLDIRECT 112.5 08/16/2009   LDLCALC 123 (H) 07/15/2015   ALT 21 07/15/2015   AST 21 07/15/2015   NA 139 07/15/2015   K 4.3 07/15/2015   CL 102 07/15/2015   CREATININE 0.98 07/15/2015   BUN 16 07/15/2015   CO2 29 07/15/2015   TSH 1.26 07/15/2015   PSA 1.18 07/15/2015   HGBA1C 5.3 08/16/2009       Assessment & Plan:

## 2017-04-24 NOTE — Patient Instructions (Signed)

## 2017-04-25 ENCOUNTER — Telehealth: Payer: Self-pay

## 2017-04-25 LAB — HEPATITIS C ANTIBODY: HCV AB: NEGATIVE

## 2017-04-25 LAB — HIV ANTIBODY (ROUTINE TESTING W REFLEX): HIV 1&2 Ab, 4th Generation: NONREACTIVE

## 2017-04-25 NOTE — Telephone Encounter (Signed)
-----   Message from Biagio Borg, MD sent at 04/24/2017  7:19 PM EDT ----- Left message on MyChart, pt to cont same tx except  The test results show that your current treatment is OK, except the LDL cholesterol is quite a bit higher, now in the moderate range.  At this point, please follow a lower cholesterol diet, and return in 6 months to the LAB only for repeat cholesterol testing.  If still elevated, you should consider a statin such as Crestor for treatment.    Micheal Guerrero to please inform pt, I will do lab order

## 2017-04-25 NOTE — Telephone Encounter (Signed)
LVM informing pt that vaccination was sent to the pharmacy.

## 2017-04-25 NOTE — Telephone Encounter (Signed)
It was sent just after visit yesterday when I realized I had not done this, so it was done, it just is not on the AVS

## 2017-04-25 NOTE — Telephone Encounter (Signed)
Pt has been informed and expressed understanding. He stated that you were going to send in the Shingrix vaccination to his pharmacy but I don't see where that has been done in the chart. Can you please send in when you have a free moment. Thanks!

## 2017-04-29 DIAGNOSIS — I10 Essential (primary) hypertension: Secondary | ICD-10-CM | POA: Insufficient documentation

## 2017-04-29 NOTE — Assessment & Plan Note (Signed)
Mod elevated today, pt believes may be spurious white coat like, does not want med tx and wants to cont to monitor on a regular basis at home with goal < 140/90

## 2017-04-29 NOTE — Assessment & Plan Note (Signed)
Mod elevated in past , d/w pt need for diet control and declines other such as statin today

## 2018-04-25 ENCOUNTER — Encounter: Payer: BLUE CROSS/BLUE SHIELD | Admitting: Internal Medicine

## 2018-06-12 ENCOUNTER — Encounter: Payer: BLUE CROSS/BLUE SHIELD | Admitting: Internal Medicine

## 2018-06-26 ENCOUNTER — Encounter: Payer: BLUE CROSS/BLUE SHIELD | Admitting: Internal Medicine

## 2018-07-22 ENCOUNTER — Ambulatory Visit (INDEPENDENT_AMBULATORY_CARE_PROVIDER_SITE_OTHER): Payer: BLUE CROSS/BLUE SHIELD | Admitting: Internal Medicine

## 2018-07-22 ENCOUNTER — Other Ambulatory Visit (INDEPENDENT_AMBULATORY_CARE_PROVIDER_SITE_OTHER): Payer: BLUE CROSS/BLUE SHIELD

## 2018-07-22 ENCOUNTER — Encounter: Payer: Self-pay | Admitting: Internal Medicine

## 2018-07-22 VITALS — BP 124/86 | HR 72 | Temp 98.3°F | Ht 69.0 in | Wt 166.0 lb

## 2018-07-22 DIAGNOSIS — R739 Hyperglycemia, unspecified: Secondary | ICD-10-CM | POA: Diagnosis not present

## 2018-07-22 DIAGNOSIS — E785 Hyperlipidemia, unspecified: Secondary | ICD-10-CM | POA: Diagnosis not present

## 2018-07-22 DIAGNOSIS — Z0000 Encounter for general adult medical examination without abnormal findings: Secondary | ICD-10-CM

## 2018-07-22 LAB — BASIC METABOLIC PANEL
BUN: 19 mg/dL (ref 6–23)
CALCIUM: 9.4 mg/dL (ref 8.4–10.5)
CO2: 28 meq/L (ref 19–32)
Chloride: 104 mEq/L (ref 96–112)
Creatinine, Ser: 0.95 mg/dL (ref 0.40–1.50)
GFR: 85.47 mL/min (ref >60.00)
Glucose, Bld: 113 mg/dL — ABNORMAL HIGH (ref 70–99)
Potassium: 3.9 mEq/L (ref 3.5–5.1)
SODIUM: 140 meq/L (ref 135–145)

## 2018-07-22 LAB — URINALYSIS, ROUTINE W REFLEX MICROSCOPIC
Bilirubin Urine: NEGATIVE
Hgb urine dipstick: NEGATIVE
Ketones, ur: NEGATIVE
Leukocytes, UA: NEGATIVE
Nitrite: NEGATIVE
SPECIFIC GRAVITY, URINE: 1.02 (ref 1.000–1.030)
TOTAL PROTEIN, URINE-UPE24: NEGATIVE
URINE GLUCOSE: NEGATIVE
Urobilinogen, UA: 0.2 (ref 0.0–1.0)
WBC, UA: NONE SEEN (ref 0–?)
pH: 7 (ref 5.0–8.0)

## 2018-07-22 LAB — LIPID PANEL
CHOL/HDL RATIO: 3
CHOLESTEROL: 196 mg/dL (ref 0–200)
HDL: 58.1 mg/dL (ref >39.00)
LDL Cholesterol: 117 mg/dL — ABNORMAL HIGH (ref 0–99)
NonHDL: 137.67
TRIGLYCERIDES: 101 mg/dL (ref 0.0–149.0)
VLDL: 20.2 mg/dL (ref 0.0–40.0)

## 2018-07-22 LAB — CBC WITH DIFFERENTIAL/PLATELET
Basophils Absolute: 0 10*3/uL (ref 0.0–0.1)
Basophils Relative: 0.9 % (ref 0.0–3.0)
EOS PCT: 1.8 % (ref 0.0–5.0)
Eosinophils Absolute: 0.1 10*3/uL (ref 0.0–0.7)
HCT: 38.6 % — ABNORMAL LOW (ref 39.0–52.0)
Hemoglobin: 13.3 g/dL (ref 13.0–17.0)
LYMPHS ABS: 1.9 10*3/uL (ref 0.7–4.0)
Lymphocytes Relative: 39.2 % (ref 12.0–46.0)
MCHC: 34.4 g/dL (ref 30.0–36.0)
MCV: 91.7 fl (ref 78.0–100.0)
MONOS PCT: 10.3 % (ref 3.0–12.0)
Monocytes Absolute: 0.5 10*3/uL (ref 0.1–1.0)
NEUTROS ABS: 2.3 10*3/uL (ref 1.4–7.7)
NEUTROS PCT: 47.8 % (ref 43.0–77.0)
Platelets: 270 10*3/uL (ref 150.0–400.0)
RBC: 4.21 Mil/uL — AB (ref 4.22–5.81)
RDW: 13 % (ref 11.5–15.5)
WBC: 4.8 10*3/uL (ref 4.0–10.5)

## 2018-07-22 LAB — HEPATIC FUNCTION PANEL
ALT: 31 U/L (ref 0–53)
AST: 21 U/L (ref 0–37)
Albumin: 4.2 g/dL (ref 3.5–5.2)
Alkaline Phosphatase: 65 U/L (ref 39–117)
Bilirubin, Direct: 0.1 mg/dL (ref 0.0–0.3)
Total Bilirubin: 0.5 mg/dL (ref 0.2–1.2)
Total Protein: 6.9 g/dL (ref 6.0–8.3)

## 2018-07-22 LAB — PSA: PSA: 1.26 ng/mL (ref 0.10–4.00)

## 2018-07-22 LAB — TSH: TSH: 1.01 u[IU]/mL (ref 0.35–4.50)

## 2018-07-22 LAB — HEMOGLOBIN A1C: Hgb A1c MFr Bld: 5.6 % (ref 4.6–6.5)

## 2018-07-22 NOTE — Progress Notes (Signed)
Subjective:    Patient ID: Micheal Guerrero, male    DOB: [birth year], 62 y.o.   MRN: [id number]  HPI  Here for wellness and f/u;  Overall doing ok;  Pt denies Chest pain, worsening SOB, DOE, wheezing, orthopnea, PND, worsening LE edema, palpitations, dizziness or syncope.  Pt denies neurological change such as new headache, facial or extremity weakness.  Pt denies polydipsia, polyuria, or low sugar symptoms. Pt states overall good compliance with treatment and medications, good tolerability, and has been trying to follow appropriate diet.  Pt denies worsening depressive symptoms, suicidal ideation or panic. No fever, night sweats, wt loss, loss of appetite, or other constitutional symptoms.  Pt states good ability with ADL's, has low fall risk, home safety reviewed and adequate, no other significant changes in hearing or vision, and only occasionally active with exercise.  No new complaints Wt Readings from Last 3 Encounters:  07/22/18 166 lb (75.3 kg)  04/24/17 172 lb (78 kg)  08/16/15 167 lb (75.8 kg)   Past Medical History:  Diagnosis Date  . DIVERTICULOSIS, COLON 08/19/2009  . HYPERLIPIDEMIA 08/19/2009  . LEG CRAMPS 08/19/2009   Past Surgical History:  Procedure Laterality Date  . s/p bakers cyst knee      @ age 17  . VASECTOMY      reports that he has quit smoking. He has never used smokeless tobacco. He reports that he drinks alcohol. He reports that he has current or past drug history. family history includes Dementia in his father. No Known Allergies No current outpatient medications on file prior to visit.   No current facility-administered medications on file prior to visit.    Review of Systems Constitutional: Negative for other unusual diaphoresis, sweats, appetite or weight changes HENT: Negative for other worsening hearing loss, ear pain, facial swelling, mouth sores or neck stiffness.   Eyes: Negative for other worsening pain, redness or other visual disturbance.    Respiratory: Negative for other stridor or swelling Cardiovascular: Negative for other palpitations or other chest pain  Gastrointestinal: Negative for worsening diarrhea or loose stools, blood in stool, distention or other pain Genitourinary: Negative for hematuria, flank pain or other change in urine volume.  Musculoskeletal: Negative for myalgias or other joint swelling.  Skin: Negative for other color change, or other wound or worsening drainage.  Neurological: Negative for other syncope or numbness. Hematological: Negative for other adenopathy or swelling Psychiatric/Behavioral: Negative for hallucinations, other worsening agitation, SI, self-injury, or new decreased concentration All other system neg per pt    Objective:   Physical Exam BP 124/86   Pulse 72   Temp 98.3 F (36.8 C) (Oral)   Ht 5\' 9"  (1.753 m)   Wt 166 lb (75.3 kg)   SpO2 96%   BMI 24.51 kg/m  VS noted,  Constitutional: Pt is oriented to person, place, and time. Appears well-developed and well-nourished, in no significant distress and comfortable Head: Normocephalic and atraumatic  Eyes: Conjunctivae and EOM are normal. Pupils are equal, round, and reactive to light Right Ear: External ear normal without discharge Left Ear: External ear normal without discharge Nose: Nose without discharge or deformity Mouth/Throat: Oropharynx is without other ulcerations and moist  Neck: Normal range of motion. Neck supple. No JVD present. No tracheal deviation present or significant neck LA or mass Cardiovascular: Normal rate, regular rhythm, normal heart sounds and intact distal pulses.   Pulmonary/Chest: WOB normal and breath sounds without rales or wheezing  Abdominal: Soft. Bowel sounds are  normal. NT. No HSM  Musculoskeletal: Normal range of motion. Exhibits no edema Lymphadenopathy: Has no other cervical adenopathy.  Neurological: Pt is alert and oriented to person, place, and time. Pt has normal reflexes. No cranial  nerve deficit. Motor grossly intact, Gait intact Skin: Skin is warm and dry. No rash noted or new ulcerations Psychiatric:  Has normal mood and affect. Behavior is normal without agitation All other system neg per pt  Lab Results  Component Value Date   WBC 5.4 04/24/2017   HGB 14.7 04/24/2017   HCT 42.1 04/24/2017   PLT 274.0 04/24/2017   GLUCOSE 103 (H) 04/24/2017   CHOL 254 (H) 04/24/2017   TRIG 103.0 04/24/2017   HDL 71.00 04/24/2017   LDLDIRECT 112.5 08/16/2009   LDLCALC 162 (H) 04/24/2017   ALT 31 04/24/2017   AST 24 04/24/2017   NA 139 04/24/2017   K 4.3 04/24/2017   CL 101 04/24/2017   CREATININE 0.98 04/24/2017   BUN 16 04/24/2017   CO2 29 04/24/2017   TSH 1.58 04/24/2017   PSA 3.15 04/24/2017   HGBA1C 5.3 08/16/2009        Assessment & Plan:

## 2018-07-22 NOTE — Assessment & Plan Note (Signed)
Consider statin for ldl > 100, cont diet

## 2018-07-22 NOTE — Assessment & Plan Note (Signed)
Mild, for a1c with labs 

## 2018-07-22 NOTE — Patient Instructions (Signed)

## 2018-07-22 NOTE — Assessment & Plan Note (Signed)

## 2018-10-29 ENCOUNTER — Ambulatory Visit (INDEPENDENT_AMBULATORY_CARE_PROVIDER_SITE_OTHER): Payer: BLUE CROSS/BLUE SHIELD

## 2018-10-29 DIAGNOSIS — Z23 Encounter for immunization: Secondary | ICD-10-CM | POA: Diagnosis not present

## 2019-01-29 ENCOUNTER — Telehealth: Payer: Self-pay

## 2019-01-29 MED ORDER — TYPHOID VACCINE PO CPDR: 1.0000 | DELAYED_RELEASE_CAPSULE | ORAL | 0 refills | Status: DC

## 2019-01-29 NOTE — Telephone Encounter (Signed)
Copied from [place] [id number]. Topic: General - Other >> Jan 28, 2019  2:54 PM Carolyn Stare wrote:  Pt said he traveling out of the country and would would like typhoid oral vaccine    Pharmacy [facility] >> Jan 28, 2019  4:45 PM Peace, Tammy L wrote: Left patient vm with [employer] employee health and wellness phone number to reach out to for travel vacs.  >> Jan 29, 2019  8:48 AM Morphies, Isidoro Donning wrote: Patient called back and spoke with the [facility]. He does not want to go through [place] and would like to know if Dr Jenny Reichmann could send this in for him to his pharmacy. He has confirmed that the pharmacy has this in stock.  Please contact patient with any questions and when this has been done if possible. >> Jan 29, 2019  8:49 AM Oneta Rack wrote: Patient would like a follow up call on his mobile # [phone]

## 2019-01-29 NOTE — Addendum Note (Signed)
Addended by: Biagio Borg on: 01/29/2019 12:45 PM   Modules accepted: Orders

## 2019-01-29 NOTE — Telephone Encounter (Signed)
Done erx 

## 2019-07-07 ENCOUNTER — Ambulatory Visit (INDEPENDENT_AMBULATORY_CARE_PROVIDER_SITE_OTHER): Payer: BLUE CROSS/BLUE SHIELD | Admitting: Internal Medicine

## 2019-07-07 ENCOUNTER — Encounter: Payer: Self-pay | Admitting: Internal Medicine

## 2019-07-07 ENCOUNTER — Ambulatory Visit: Payer: Self-pay | Admitting: Internal Medicine

## 2019-07-07 ENCOUNTER — Other Ambulatory Visit: Payer: Self-pay

## 2019-07-07 VITALS — BP 134/86 | HR 82 | Temp 97.9°F | Resp 16 | Ht 69.0 in | Wt 157.0 lb

## 2019-07-07 DIAGNOSIS — E611 Iron deficiency: Secondary | ICD-10-CM

## 2019-07-07 DIAGNOSIS — E538 Deficiency of other specified B group vitamins: Secondary | ICD-10-CM

## 2019-07-07 DIAGNOSIS — R109 Unspecified abdominal pain: Secondary | ICD-10-CM

## 2019-07-07 DIAGNOSIS — E559 Vitamin D deficiency, unspecified: Secondary | ICD-10-CM | POA: Diagnosis not present

## 2019-07-07 DIAGNOSIS — R739 Hyperglycemia, unspecified: Secondary | ICD-10-CM

## 2019-07-07 MED ORDER — POLYETHYLENE GLYCOL 3350 17 GM/SCOOP PO POWD: 17.0000 g | Freq: Two times a day (BID) | ORAL | 1 refills | Status: DC | PRN

## 2019-07-07 MED ORDER — DOCUSATE SODIUM 100 MG PO CAPS: 100.0000 mg | ORAL_CAPSULE | Freq: Two times a day (BID) | ORAL | 2 refills | Status: DC

## 2019-07-07 NOTE — Telephone Encounter (Signed)
Noted  

## 2019-07-07 NOTE — Patient Instructions (Signed)
Please take all new medication as prescribed - the miralax and colace (stool softner) for constipation  Please continue all other medications as before, and refills have been done if requested.  Please have the pharmacy call with any other refills you may need.  Please keep your appointments with your specialists as you may have planned  Please go to the XRAY Department in the Basement (go straight as you get off the elevator) for the x-ray testing tomorrow  Please go to the LAB in the Basement (turn left off the elevator) for the tests to be done tomorrow  You will be contacted by phone if any changes need to be made immediately.  Otherwise, you will receive a letter about your results with an explanation, but please check with MyChart first.  Please remember to sign up for MyChart if you have not done so, as this will be important to you in the future with finding out test results, communicating by private email, and scheduling acute appointments online when needed.

## 2019-07-07 NOTE — Progress Notes (Signed)
Subjective:    Patient ID: Micheal Guerrero, male    DOB: 05/23/1956, 63 y.o.   MRN: [id number]  HPI  Here after 2 wks trip in an RV to the [place]; has been sitting driving quite a bit and diet changes; now c/o increased constipation like symptoms with epigastric pain crampy, mild to mod to occasionally severe, intermittent, for 4-5 days.  Pain transiently improved with BMs, helped by miralax prn.  Denies worsening reflux, fever, dysphagia, n/v, bowel change or blood. Appetite has been ok but states lost over 3 lbs with recent abd pains, afraid to eat and make it worse.  Very concerned about possible obstruction or cancer.   Pt denies night sweats, loss of appetite, or other constitutional symptoms  Denies urinary symptoms such as dysuria, frequency, urgency, flank pain, hematuria or n/v, fever, chills.   Past Medical History:  Diagnosis Date  . DIVERTICULOSIS, COLON 08/19/2009  . HYPERLIPIDEMIA 08/19/2009  . LEG CRAMPS 08/19/2009   Past Surgical History:  Procedure Laterality Date  . s/p bakers cyst knee      @ age 56  . VASECTOMY      reports that he has quit smoking. He has never used smokeless tobacco. He reports current alcohol use. He reports current drug use. family history includes Dementia in his father. No Known Allergies No current outpatient medications on file prior to visit.   No current facility-administered medications on file prior to visit.    Review of Systems  Constitutional: Negative for other unusual diaphoresis or sweats HENT: Negative for ear discharge or swelling Eyes: Negative for other worsening visual disturbances Respiratory: Negative for stridor or other swelling  Gastrointestinal: Negative for worsening distension or other blood Genitourinary: Negative for retention or other urinary change Musculoskeletal: Negative for other MSK pain or swelling Skin: Negative for color change or other new lesions Neurological: Negative for worsening tremors  and other numbness  Psychiatric/Behavioral: Negative for worsening agitation or other fatigue All other system neg per pt    Objective:   Physical Exam BP 134/86   Pulse 82   Temp 97.9 F (36.6 C) (Oral)   Resp 16   Ht 5\' 9"  (1.753 m)   Wt 157 lb (71.2 kg)   SpO2 97%   BMI 23.18 kg/m  VS noted, not ill appearing Constitutional: Pt appears in NAD HENT: Head: NCAT.  Right Ear: External ear normal.  Left Ear: External ear normal.  Eyes: . Pupils are equal, round, and reactive to light. Conjunctivae and EOM are normal Nose: without d/c or deformity Neck: Neck supple. Gross normal ROM Cardiovascular: Normal rate and regular rhythm.   Pulmonary/Chest: Effort normal and breath sounds without rales or wheezing.  Abd:  Soft, NT, ND, + BS, no organomegaly - benign exam Neurological: Pt is alert. At baseline orientation, motor grossly intact Skin: Skin is warm. No rashes, other new lesions, no LE edema Psychiatric: Pt behavior is normal without agitation  No other exam findings Lab Results  Component Value Date   WBC 4.8 07/22/2018   HGB 13.3 07/22/2018   HCT 38.6 (L) 07/22/2018   PLT 270.0 07/22/2018   GLUCOSE 113 (H) 07/22/2018   CHOL 196 07/22/2018   TRIG 101.0 07/22/2018   HDL 58.10 07/22/2018   LDLDIRECT 112.5 08/16/2009   LDLCALC 117 (H) 07/22/2018   ALT 31 07/22/2018   AST 21 07/22/2018   NA 140 07/22/2018   K 3.9 07/22/2018   CL 104 07/22/2018   CREATININE 0.95  07/22/2018   BUN 19 07/22/2018   CO2 28 07/22/2018   TSH 1.01 07/22/2018   PSA 1.26 07/22/2018   HGBA1C 5.6 07/22/2018       Assessment & Plan:

## 2019-07-07 NOTE — Assessment & Plan Note (Signed)
stable overall by history and exam, recent data reviewed with pt, and pt to continue medical treatment as before,  to f/u any worsening symptoms or concerns  

## 2019-07-07 NOTE — Telephone Encounter (Signed)
Pt. Reports he started having midline abdominal pain last Thursday. Hurts more on the right and seems to be worse after eating. Did not sleep "at all last night." Pain now 4/10. Having normal bowel movements, no diarrhea.Warm transfer to [place] in the practice.  Answer Assessment - Initial Assessment Questions 1. LOCATION: "Where does it hurt?"      Midline 2. RADIATION: "Does the pain shoot anywhere else?" (e.g., chest, back)      No 3. ONSET: "When did the pain begin?" (Minutes, hours or days ago)       Started last Thursday 4. SUDDEN: "Gradual or sudden onset?"     Gradual 5. PATTERN "Does the pain come and go, or is it constant?"    - If constant: "Is it getting better, staying the same, or worsening?"      (Note: Constant means the pain never goes away completely; most serious pain is constant and it progresses)     - If intermittent: "How long does it last?" "Do you have pain now?"     (Note: Intermittent means the pain goes away completely between bouts)     Comes and goes - worse after eating 6. SEVERITY: "How bad is the pain?"  (e.g., Scale 1-10; mild, moderate, or severe)    - MILD (1-3): doesn't interfere with normal activities, abdomen soft and not tender to touch     - MODERATE (4-7): interferes with normal activities or awakens from sleep, tender to touch     - SEVERE (8-10): excruciating pain, doubled over, unable to do any normal activities        Now 4   Earlier - 8 7. RECURRENT SYMPTOM: "Have you ever had this type of abdominal pain before?" If so, ask: "When was the last time?" and "What happened that time?"      No 8. CAUSE: "What do you think is causing the abdominal pain?"     Unsure 9. RELIEVING/AGGRAVATING FACTORS: "What makes it better or worse?" (e.g., movement, antacids, bowel movement)     Eating makes it worse 10. OTHER SYMPTOMS: "Has there been any vomiting, diarrhea, constipation, or urine problems?"       No  Protocols used: ABDOMINAL PAIN -  MALE-A-AH

## 2019-07-07 NOTE — Assessment & Plan Note (Signed)
Mild to mod, suspect constipatoin, for miralax and colace asd, for labs and xray as ordered, consider CT, to f/u any worsening symptoms or concerns

## 2019-07-08 ENCOUNTER — Other Ambulatory Visit: Payer: Self-pay | Admitting: Internal Medicine

## 2019-07-08 ENCOUNTER — Ambulatory Visit (INDEPENDENT_AMBULATORY_CARE_PROVIDER_SITE_OTHER)
Admission: RE | Admit: 2019-07-08 | Discharge: 2019-07-08 | Disposition: A | Discharge status: Home / Self Care | Payer: BLUE CROSS/BLUE SHIELD | Source: Ambulatory Visit | Attending: Internal Medicine | Admitting: Internal Medicine

## 2019-07-08 ENCOUNTER — Other Ambulatory Visit: Payer: Self-pay

## 2019-07-08 ENCOUNTER — Other Ambulatory Visit (INDEPENDENT_AMBULATORY_CARE_PROVIDER_SITE_OTHER): Payer: BLUE CROSS/BLUE SHIELD

## 2019-07-08 DIAGNOSIS — R109 Unspecified abdominal pain: Secondary | ICD-10-CM

## 2019-07-08 DIAGNOSIS — E785 Hyperlipidemia, unspecified: Secondary | ICD-10-CM | POA: Diagnosis not present

## 2019-07-08 DIAGNOSIS — E538 Deficiency of other specified B group vitamins: Secondary | ICD-10-CM | POA: Diagnosis not present

## 2019-07-08 DIAGNOSIS — Z125 Encounter for screening for malignant neoplasm of prostate: Secondary | ICD-10-CM

## 2019-07-08 DIAGNOSIS — R739 Hyperglycemia, unspecified: Secondary | ICD-10-CM

## 2019-07-08 DIAGNOSIS — K859 Acute pancreatitis without necrosis or infection, unspecified: Secondary | ICD-10-CM

## 2019-07-08 DIAGNOSIS — E559 Vitamin D deficiency, unspecified: Secondary | ICD-10-CM

## 2019-07-08 DIAGNOSIS — E611 Iron deficiency: Secondary | ICD-10-CM

## 2019-07-08 DIAGNOSIS — Z0000 Encounter for general adult medical examination without abnormal findings: Secondary | ICD-10-CM

## 2019-07-08 LAB — CBC WITH DIFFERENTIAL/PLATELET
Basophils Absolute: 0.1 10*3/uL (ref 0.0–0.1)
Basophils Relative: 0.7 % (ref 0.0–3.0)
Eosinophils Absolute: 0.1 10*3/uL (ref 0.0–0.7)
Eosinophils Relative: 1.2 % (ref 0.0–5.0)
HCT: 43 % (ref 39.0–52.0)
Hemoglobin: 15 g/dL (ref 13.0–17.0)
Lymphocytes Relative: 28.5 % (ref 12.0–46.0)
Lymphs Abs: 2.3 10*3/uL (ref 0.7–4.0)
MCHC: 34.9 g/dL (ref 30.0–36.0)
MCV: 92.4 fl (ref 78.0–100.0)
Monocytes Absolute: 0.7 10*3/uL (ref 0.1–1.0)
Monocytes Relative: 9.4 % (ref 3.0–12.0)
Neutro Abs: 4.8 10*3/uL (ref 1.4–7.7)
Neutrophils Relative %: 60.2 % (ref 43.0–77.0)
Platelets: 342 10*3/uL (ref 150.0–400.0)
RBC: 4.65 Mil/uL (ref 4.22–5.81)
RDW: 12.7 % (ref 11.5–15.5)
WBC: 8 10*3/uL (ref 4.0–10.5)

## 2019-07-08 LAB — BASIC METABOLIC PANEL
BUN: 15 mg/dL (ref 6–23)
CO2: 26 mEq/L (ref 19–32)
Calcium: 9.7 mg/dL (ref 8.4–10.5)
Chloride: 100 mEq/L (ref 96–112)
Creatinine, Ser: 0.92 mg/dL (ref 0.40–1.50)
GFR: 83.18 mL/min (ref >60.00)
Glucose, Bld: 123 mg/dL — ABNORMAL HIGH (ref 70–99)
Potassium: 3.9 mEq/L (ref 3.5–5.1)
Sodium: 136 mEq/L (ref 135–145)

## 2019-07-08 LAB — IBC PANEL
Iron: 53 ug/dL (ref 42–165)
Saturation Ratios: 16.8 % — ABNORMAL LOW (ref 20.0–50.0)
Transferrin: 225 mg/dL (ref 212.0–360.0)

## 2019-07-08 LAB — LIPID PANEL
Cholesterol: 165 mg/dL (ref 0–200)
HDL: 55.1 mg/dL (ref >39.00)
LDL Cholesterol: 89 mg/dL (ref 0–99)
NonHDL: 110.24
Total CHOL/HDL Ratio: 3
Triglycerides: 105 mg/dL (ref 0.0–149.0)
VLDL: 21 mg/dL (ref 0.0–40.0)

## 2019-07-08 LAB — HEPATIC FUNCTION PANEL
ALT: 24 U/L (ref 0–53)
AST: 22 U/L (ref 0–37)
Albumin: 4.5 g/dL (ref 3.5–5.2)
Alkaline Phosphatase: 66 U/L (ref 39–117)
Bilirubin, Direct: 0.1 mg/dL (ref 0.0–0.3)
Total Bilirubin: 0.7 mg/dL (ref 0.2–1.2)
Total Protein: 7.2 g/dL (ref 6.0–8.3)

## 2019-07-08 LAB — URINALYSIS, ROUTINE W REFLEX MICROSCOPIC
Bilirubin Urine: NEGATIVE
Hgb urine dipstick: NEGATIVE
Ketones, ur: NEGATIVE
Leukocytes,Ua: NEGATIVE
Nitrite: NEGATIVE
RBC / HPF: NONE SEEN (ref 0–?)
Specific Gravity, Urine: 1.01 (ref 1.000–1.030)
Total Protein, Urine: NEGATIVE
Urine Glucose: NEGATIVE
Urobilinogen, UA: 0.2 (ref 0.0–1.0)
pH: 6 (ref 5.0–8.0)

## 2019-07-08 LAB — VITAMIN B12: Vitamin B-12: 333 pg/mL (ref 211–911)

## 2019-07-08 LAB — LIPASE: Lipase: 296 U/L — ABNORMAL HIGH (ref 11.0–59.0)

## 2019-07-08 LAB — PSA: PSA: 1.09 ng/mL (ref 0.10–4.00)

## 2019-07-08 LAB — TSH: TSH: 2.23 u[IU]/mL (ref 0.35–4.50)

## 2019-07-08 LAB — VITAMIN D 25 HYDROXY (VIT D DEFICIENCY, FRACTURES): VITD: 19.4 ng/mL — ABNORMAL LOW (ref 30.00–100.00)

## 2019-07-08 LAB — HEMOGLOBIN A1C: Hgb A1c MFr Bld: 5.6 % (ref 4.6–6.5)

## 2019-07-08 MED ORDER — VITAMIN D (ERGOCALCIFEROL) 1.25 MG (50000 UNIT) PO CAPS: 50000.0000 [IU] | ORAL_CAPSULE | ORAL | 0 refills | Status: DC

## 2019-07-08 NOTE — Telephone Encounter (Signed)
none

## 2019-07-09 ENCOUNTER — Telehealth: Payer: Self-pay | Admitting: Internal Medicine

## 2019-07-09 ENCOUNTER — Telehealth: Payer: Self-pay

## 2019-07-09 ENCOUNTER — Ambulatory Visit
Admission: RE | Admit: 2019-07-09 | Discharge: 2019-07-09 | Disposition: A | Discharge status: Home / Self Care | Payer: BLUE CROSS/BLUE SHIELD | Source: Ambulatory Visit | Attending: Internal Medicine | Admitting: Internal Medicine

## 2019-07-09 DIAGNOSIS — K859 Acute pancreatitis without necrosis or infection, unspecified: Secondary | ICD-10-CM

## 2019-07-09 MED ORDER — IOPAMIDOL (ISOVUE-300) INJECTION 61%
100.0000 mL | Freq: Once | INTRAVENOUS | Status: AC | PRN
  Administered 2019-07-09: 100 mL via INTRAVENOUS

## 2019-07-09 NOTE — Telephone Encounter (Signed)
Spoke with pt and he states that he does not want to go to the ER, reports he only hurts when he eats. Does not want to wait forever in the ER. Wants to know why he can't just have an ERCP done. Wants to be seen in the office by GI doc. Dr. Bryan Lemma has an appt in the am at 8:30am. Is it ok to offer that appt? Please advise.

## 2019-07-09 NOTE — Telephone Encounter (Signed)
Covid-19 screening questions   Do you now or have you had a fever in the last 14 days? No  Do you have any respiratory symptoms of shortness of breath or cough now or in the last 14 days? No  Do you have any family members or close contacts with diagnosed or suspected Covid-19 in the past 14 days? No  Have you been tested for Covid-19 and found to be positive? No        

## 2019-07-09 NOTE — Telephone Encounter (Signed)
Pt has seen Henrene Pastor in the past for procedures. Referral in epic from PCP for acute pancreatitis. Pt states symptoms started last Tuesday. Reports he is in extreme pain when he eats so he isn't eating and he is losing weight daily. Henrene Pastor is out of the office the rest of the week. As DOD please advise regarding scheduling.

## 2019-07-09 NOTE — Telephone Encounter (Signed)
please see EPIC referral. patient is being referred for Acute pancreatitis without infection or necrosis, unspecified pancreatitis type.  Patient states that he is in extreme pain several hours after eating. Upper abd pain. Patient states that he has not been able to eat and is losing weight everyday.

## 2019-07-09 NOTE — Telephone Encounter (Signed)
Hi Linda  I have reviewed CT scan Abdo/pelvis myself.  There are some typo errors in the report.  PD is significantly dilated with abrupt cutoff at [facility] -highly suggestive of mass.  Rule out other causes. CBD looks normal.  Lipase is mildly elevated.  Plan; -If he is in that much of pain (extreme pain), he would be better off coming over to ED. May need admission for pain control, IVF and further eval. -He would need MRCP ASAP, likely followed by [facility]. -Check CA 19-9, CEA level. -Please let me know  Thx  RG

## 2019-07-09 NOTE — Telephone Encounter (Signed)
-----   Message from Lavena Bullion, DO sent at 07/09/2019  4:23 PM EDT ----- Regarding: RE: Referral No problem, happy to see him in the AM. Thanks for the heads up. ----- Message ----- From: Algernon Huxley, RN Sent: 07/09/2019   3:36 PM EDT To: Lavena Bullion, DO Subject: Referral                                       Pt has seen Micheal Guerrero in the past for procedures. Referral in epic from PCP for acute pancreatitis. Pt states symptoms started last Tuesday. Reports he is in extreme pain when he eats so he isn't eating and he is losing weight daily. Micheal Guerrero is out of the office the rest of the week. As DOD please advise regarding scheduling.  Dr. Loletha Grayer I sent this to Dr. Lyndel Safe as he is DOD but I noticed you have an opening tomorrow morning at 8:30am in [facility]. Is it ok to add him to your schedule in the morning? Please advise.  Thanks, [employer]

## 2019-07-10 ENCOUNTER — Other Ambulatory Visit: Payer: Self-pay

## 2019-07-10 ENCOUNTER — Ambulatory Visit (INDEPENDENT_AMBULATORY_CARE_PROVIDER_SITE_OTHER): Payer: BLUE CROSS/BLUE SHIELD | Admitting: Gastroenterology

## 2019-07-10 ENCOUNTER — Telehealth: Payer: Self-pay | Admitting: Gastroenterology

## 2019-07-10 ENCOUNTER — Telehealth: Payer: Self-pay

## 2019-07-10 ENCOUNTER — Encounter: Payer: Self-pay | Admitting: Gastroenterology

## 2019-07-10 ENCOUNTER — Other Ambulatory Visit: Payer: BLUE CROSS/BLUE SHIELD

## 2019-07-10 VITALS — BP 110/72 | HR 85 | Temp 98.4°F | Ht 69.0 in | Wt 156.0 lb

## 2019-07-10 DIAGNOSIS — K859 Acute pancreatitis without necrosis or infection, unspecified: Secondary | ICD-10-CM

## 2019-07-10 DIAGNOSIS — R1013 Epigastric pain: Secondary | ICD-10-CM

## 2019-07-10 DIAGNOSIS — K8689 Other specified diseases of pancreas: Secondary | ICD-10-CM

## 2019-07-10 DIAGNOSIS — K5730 Diverticulosis of large intestine without perforation or abscess without bleeding: Secondary | ICD-10-CM

## 2019-07-10 NOTE — Patient Instructions (Signed)
If you are age 63 or older, your body mass index should be between 23-30. Your Body mass index is 23.04 kg/m. If this is out of the aforementioned range listed, please consider follow up with your Primary Care Provider.  If you are age 32 or younger, your body mass index should be between 19-25. Your Body mass index is 23.04 kg/m. If this is out of the aformentioned range listed, please consider follow up with your Primary Care Provider.   Your provider has requested lab work- location - [facility] on [address] in [place]. Press "B" on the elevator. The lab is located at the first door on the left as you exit the elevator.   You have been scheduled for an MRI at [facility] on 07/15/19. Your appointment time is 7 am. Please arrive 15 minutes prior to your appointment time for registration purposes. Please make certain not to have anything to eat or drink after midnight the day prior to your test. In addition, if you have any metal in your body, have a pacemaker or defibrillator, please be sure to let your ordering physician know. This test typically takes 45 minutes to 1 hour to complete. Should you need to reschedule, please call [phone] to do so.  Thank you for choosing me and [facility].  Gerrit Heck, MD

## 2019-07-10 NOTE — Telephone Encounter (Signed)
-----   Message from [place], DO sent at 07/10/2019 12:56 PM EDT ----- This sounds great!  Thank you both for your input on his care!  I was hoping that we could skip over the MRI and go to EUS.   I called to update him on the plan.Vito ----- Message ----- From: Irving Copas., MD Sent: 07/10/2019  11:43 AM EDT To: Milus Banister, MD, Timothy Lasso, RN, #  Agreed.  If no history significant for pancreatitis, we should plan EUS directly at time interval, unless he continues to have significant symptoms. GM ----- Message ----- From: Milus Banister, MD Sent: 07/10/2019  10:47 AM EDT To: Timothy Lasso, RN, Irving Copas., MD, #  Vito, Thank you as always.  I don't think he needs MR.  Would put him on for upper EUS in 3-4 weeks from now. Not a great story for acute pancreatitis but putting this off a few weeks will allow any associated inflammation to hopefully subside, increasing yield of EUS.    Gabe, unless you disagree I think we can book him directly without MR.  Patric Vanpelt, He needs upper EUS, radial +/- linear, first available appt with GAbe or myself (3-4 weeks from now is ideal clinically).  Thanks    ----- Message ----- From: Lavena Bullion, DO Sent: 07/10/2019   9:53 AM EDT To: Milus Banister, MD, #  Morning, Just saw this very nice otherwise healthy 63 year old male with MEG pain and 7-8#weight loss with recent labs notable for elevated lipase and subsequent CT with PD dilation and apparent abrupt cut off with HOP along with peripancreatic inflammation.  Planning on sending for tumor markers and MRI/MRCP, but suspect he will soon need your services as well.  Do you guys prefer to go for MRCP/MRI first, or go straight to EUS with these types of cases?Thanks.[employer]

## 2019-07-10 NOTE — Telephone Encounter (Signed)
Please advise 

## 2019-07-10 NOTE — Telephone Encounter (Signed)
Thanks Patty!  I talked to him prior to seeing your message. yes he was certainly frustrated even despite my attempts to explain the medical rationale for waiting.  Hopefully I was able to ease his mind a little bit, but I suspect he will be calling in again.

## 2019-07-10 NOTE — Telephone Encounter (Signed)
-----   Message from Milus Banister, MD sent at 07/10/2019 10:47 AM EDT ----- Luanna Salk, Thank you as always.  I don't think he needs MR.  Would put him on for upper EUS in 3-4 weeks from now. Not a great story for acute pancreatitis but putting this off a few weeks will allow any associated inflammation to hopefully subside, increasing yield of EUS.    Gabe, unless you disagree I think we can book him directly without MR.  Conswella Bruney, He needs upper EUS, radial +/- linear, first available appt with GAbe or myself (3-4 weeks from now is ideal clinically).  Thanks    ----- Message ----- From: Lavena Bullion, DO Sent: 07/10/2019   9:53 AM EDT To: Milus Banister, MD, #  Morning, Just saw this very nice otherwise healthy 63 year old male with MEG pain and 7-8#weight loss with recent labs notable for elevated lipase and subsequent CT with PD dilation and apparent abrupt cut off with HOP along with peripancreatic inflammation.  Planning on sending for tumor markers and MRI/MRCP, but suspect he will soon need your services as well.  Do you guys prefer to go for MRCP/MRI first, or go straight to EUS with these types of cases?Thanks.[employer]

## 2019-07-10 NOTE — Telephone Encounter (Signed)
I did give the pt the instructions and sent him all paperwork via My Chart.  He has been advised to call with any questions.

## 2019-07-10 NOTE — Progress Notes (Signed)
P  Chief Complaint:    Abdominal pain, elevated lipase, abnormal CT  HPI:    Patient is a 63 y.o. male referred to the GI clinic for expedited evaluation for recent onset MEG pain, anorexia, and weight loss, with labs notable for chemical pancreatitis.  Was otherwise in his usual state of health, and recently took an RV trip cross-country.  Admittedly had some dietary indiscretions during this trip, and thought the pain was due to diet/constipation.  Also endorsed back pain, which he thought was due to prolonged driving.  However, since returning, has continued to have postprandial MEG pain with radiation to the back.  Pain tends to start within a couple hours of eating, independent of food type.  Occasionally with mild nausea but no vomiting.  No changes in bowel habits, hematochezia, melena.  No fever, chills, night sweats.  Pain is been present for 8 days, and can last multiple hours.  Has trialed a bland diet recently, with no improvement. Trialed Robaxin without any relief. No prior similar sxs. Has lost approx 7 lbs in 8 days. Now endorsing sitophobia due to postprandial pain.  Was seen by his PCM for this on 8/3 and referred for labs and imaging as outlined below.  No prior personal or family history of pancreatic or hepatobiliary disease.  Recent evaluation: -Lipase 296 -Normal CBC, UA, TSH, CMP -Iron saturation mildly reduced at 16.8, otherwise normal iron indices.  Normal B12 - CT (07/09/2019): Focal fatty infiltration otherwise normal liver with normal CBD.  Significant PD dilation with abrupt cut off at HOP, and peripancreatic inflammatory changes.  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  Sigmoid diverticulosis.  No known family history of CRC, GI malignancy, liver disease, pancreatic disease, or IBD.    Endoscopic history: - Colonoscopy (08/2009, Dr. Henrene Pastor): Sigmoid diverticulosis, otherwise normal.  Recommended repeat in 10 years    Review of systems:     No chest pain, no SOB, no  fevers, no urinary sx   Past Medical History:  Diagnosis Date  . DIVERTICULOSIS, COLON 08/19/2009  . HYPERLIPIDEMIA 08/19/2009  . LEG CRAMPS 08/19/2009    Patient's surgical history, family medical history, social history, medications and allergies were all reviewed in Epic    Current Outpatient Medications  Medication Sig Dispense Refill  . Vitamin D, Ergocalciferol, (DRISDOL) 1.25 MG (50000 UT) CAPS capsule Take 1 capsule (50,000 Units total) by mouth every 7 (seven) days. 12 capsule 0   No current facility-administered medications for this visit.     Physical Exam:     BP 110/72   Pulse 85   Temp 98.4 F (36.9 C)   Ht 5\' 9"  (1.753 m)   Wt 156 lb (70.8 kg)   BMI 23.04 kg/m   GENERAL:  Pleasant male in NAD PSYCH: : Cooperative, normal affect EENT:  conjunctiva pink, mucous membranes moist, neck supple without masses CARDIAC:  RRR, no murmur heard, no peripheral edema PULM: Normal respiratory effort, lungs CTA bilaterally, no wheezing ABDOMEN:  Nondistended, soft, nontender. No obvious masses, no hepatomegaly,  normal bowel sounds SKIN:  turgor, no lesions seen Musculoskeletal:  Normal muscle tone, normal strength NEURO: Alert and oriented x 3, no focal neurologic deficits   IMPRESSION and PLAN:    1) Pancreatitis 2) MEG pain 3) PD Dilation on CT  63 year old male with recent onset MEG pain, with radiation to the back, weight loss, sitophobia.  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 intake as tolerated  4) Diverticulosis: -No clinical  or radiographic evidence of diverticulitis  5) CRC Screening: -Due for routine, age-appropriate, average risk screening this year.  However, certainly will hold off on routine colonoscopy pending work-up of the above, pressing issues  I spent a total of 45 minutes of face-to-face time with the patient. Greater than 50% of the time was spent counseling and coordinating care.        Lavena Bullion ,DO, FACG 07/10/2019, 8:46 AM

## 2019-07-10 NOTE — Telephone Encounter (Signed)
FYI Dr Marlynn Perking

## 2019-07-10 NOTE — Telephone Encounter (Signed)
EUS scheduled for 9/3 730 am [facility] COVID testing on 8/31.  I spoke with the pt to go over instructions and he was very frustrated that the EUS would be done in September and wanted it done NOW.  He says he would like a call from Dr Ardis Hughs to discuss.  Dr Ardis Hughs the pt will be available after 1:30 pm at [phone].  Will you call?

## 2019-07-10 NOTE — Telephone Encounter (Signed)
I spoke with him on the phone. Explained the recommendations.  He understands but I am not sure he is satisfied.  He is going to discuss further with family and friends and says he will be in touch with [place].  Patty, If you don't hear from him by tomorrow afternoon, please reach out to him on Monday to see what his plans are, how we can help him.  Thanks

## 2019-07-11 LAB — CEA: CEA: 0.8 ng/mL

## 2019-07-11 LAB — CANCER ANTIGEN 19-9: CA 19-9: 48 U/mL — ABNORMAL HIGH (ref <34)

## 2019-07-11 NOTE — Telephone Encounter (Signed)
Called and spoke with patient-patient is requesting the referral to [facility] be sent in the next hour as it is imperative the referral is processed today-patient informed the referral has already been faxed to [facility] and can also be faxed to the number the patient provided;   Referral has been faxed to alternative number at this time;  Patient advised to call back to the office should questions/concerns arise; Patient verbalized understanding of information/instructions;

## 2019-07-11 NOTE — Telephone Encounter (Signed)
The pt EUS appt has been cancelled.

## 2019-07-11 NOTE — Telephone Encounter (Signed)
Patient calling back regarding this. He is requesting that referral be placed today.

## 2019-07-11 NOTE — Telephone Encounter (Signed)
Referral information printed and RN is waiting for a response for approval on referral to [facility]- Please advise if this is appropriate for this patient

## 2019-07-11 NOTE — Telephone Encounter (Signed)
Patient calling back regarding this. Patient says that referral and records can be faxed to [phone]. Patient requesting callback when referral has been sent.

## 2019-07-11 NOTE — Telephone Encounter (Signed)
Got it, thanks.  Micheal Guerrero, Please cancel his currently scheduled 9/3 EUS.  thanks

## 2019-07-11 NOTE — Telephone Encounter (Signed)
Ok, you can place a referral to Dr. Harl Bowie at [facility], but there is certainly a possibility that there could be extended wait times to be evaluated there. I will otherwise remain available for any questions that he has in the interim. Thank you.

## 2019-07-11 NOTE — Telephone Encounter (Signed)
Yes, you can place a referral to Dr. Harl Bowie at [facility], but there is certainly a possibility that there could be extended wait times to be evaluated there. I will otherwise remain available for any questions that he has in the interim. Thank you.   Dr. Ardis Hughs, FYI since he was scheduled for EUS with you on 08/07/19.

## 2019-07-14 ENCOUNTER — Other Ambulatory Visit: Payer: Self-pay

## 2019-07-14 NOTE — Telephone Encounter (Signed)
error 

## 2019-07-15 ENCOUNTER — Ambulatory Visit (HOSPITAL_COMMUNITY): Payer: BLUE CROSS/BLUE SHIELD

## 2019-07-24 ENCOUNTER — Ambulatory Visit (INDEPENDENT_AMBULATORY_CARE_PROVIDER_SITE_OTHER): Payer: BC Managed Care – PPO | Admitting: Internal Medicine

## 2019-07-24 ENCOUNTER — Encounter: Payer: Self-pay | Admitting: Internal Medicine

## 2019-07-24 ENCOUNTER — Other Ambulatory Visit: Payer: Self-pay

## 2019-07-24 VITALS — BP 100/70 | HR 56 | Temp 97.9°F | Ht 69.0 in | Wt 153.0 lb

## 2019-07-24 DIAGNOSIS — R739 Hyperglycemia, unspecified: Secondary | ICD-10-CM | POA: Diagnosis not present

## 2019-07-24 DIAGNOSIS — Z23 Encounter for immunization: Secondary | ICD-10-CM

## 2019-07-24 DIAGNOSIS — Z0000 Encounter for general adult medical examination without abnormal findings: Secondary | ICD-10-CM | POA: Diagnosis not present

## 2019-07-24 DIAGNOSIS — K8689 Other specified diseases of pancreas: Secondary | ICD-10-CM | POA: Insufficient documentation

## 2019-07-24 NOTE — Assessment & Plan Note (Addendum)
For f/u EUS soon, declines tramadol for intermittent back pain with diet that stirs up the pancreas

## 2019-07-24 NOTE — Assessment & Plan Note (Signed)

## 2019-07-24 NOTE — Progress Notes (Signed)
Subjective:    Patient ID: Micheal Guerrero, male    DOB: [birth year], 63 y.o.   MRN: [id number]  HPI  Here for wellness and f/u;  Overall doing ok;  Pt denies Chest pain, worsening SOB, DOE, wheezing, orthopnea, PND, worsening LE edema, palpitations, dizziness or syncope.  Pt denies neurological change such as new headache, facial or extremity weakness.  Pt denies polydipsia, polyuria, or low sugar symptoms. Pt states overall good compliance with treatment and medications, good tolerability, and has been trying to follow appropriate diet.  Pt denies worsening depressive symptoms, suicidal ideation or panic. No fever, night sweats, or other constitutional symptoms.  Pt states good ability with ADL's, has low fall risk, home safety reviewed and adequate, no other significant changes in hearing or vision, and occasionally active with exercise, actually ran 4 miles yesterday..  Had an EUS at [facility] with biopsy inconclusive and asked to return in 2 wks, for f/u appt with repeat EUS and biopsy in 6 days from now.    Did have small duodenal ulcer noted biopsy neg with EUS.  Wt is overall down several lbs. Wt Readings from Last 3 Encounters:  07/24/19 153 lb (69.4 kg)  07/10/19 156 lb (70.8 kg)  07/07/19 157 lb (71.2 kg)   Past Medical History:  Diagnosis Date  . DIVERTICULOSIS, COLON 08/19/2009  . HYPERLIPIDEMIA 08/19/2009  . LEG CRAMPS 08/19/2009   Past Surgical History:  Procedure Laterality Date  . s/p bakers cyst knee      @ age 45  . VASECTOMY      reports that he has quit smoking. He has never used smokeless tobacco. He reports current alcohol use. He reports current drug use. family history includes Dementia in his father. No Known Allergies Current Outpatient Medications on File Prior to Visit  Medication Sig Dispense Refill  . pantoprazole (PROTONIX) 40 MG tablet Take by mouth.    . Vitamin D, Ergocalciferol, (DRISDOL) 1.25 MG (50000 UT) CAPS capsule Take 1 capsule (50,000 Units total)  by mouth every 7 (seven) days. 12 capsule 0   No current facility-administered medications on file prior to visit.    Review of Systems Constitutional: Negative for other unusual diaphoresis, sweats, appetite or weight changes HENT: Negative for other worsening hearing loss, ear pain, facial swelling, mouth sores or neck stiffness.   Eyes: Negative for other worsening pain, redness or other visual disturbance.  Respiratory: Negative for other stridor or swelling Cardiovascular: Negative for other palpitations or other chest pain  Gastrointestinal: Negative for worsening diarrhea or loose stools, blood in stool, distention or other pain Genitourinary: Negative for hematuria, flank pain or other change in urine volume.  Musculoskeletal: Negative for myalgias or other joint swelling.  Skin: Negative for other color change, or other wound or worsening drainage.  Neurological: Negative for other syncope or numbness. Hematological: Negative for other adenopathy or swelling Psychiatric/Behavioral: Negative for hallucinations, other worsening agitation, SI, self-injury, or new decreased concentration All other system neg per pt    Objective:   Physical Exam BP 100/70 (BP Location: Left Arm, Patient Position: Sitting, Cuff Size: Normal)   Pulse (!) 56   Temp 97.9 F (36.6 C) (Oral)   Ht 5\' 9"  (1.753 m)   Wt 153 lb (69.4 kg)   SpO2 96%   BMI 22.59 kg/m  VS noted,  Constitutional: Pt is oriented to person, place, and time. Appears well-developed and well-nourished, in no significant distress and comfortable Head: Normocephalic and atraumatic  Eyes: Conjunctivae  and EOM are normal. Pupils are equal, round, and reactive to light Right Ear: External ear normal without discharge Left Ear: External ear normal without discharge Nose: Nose without discharge or deformity Mouth/Throat: Oropharynx is without other ulcerations and moist  Neck: Normal range of motion. Neck supple. No JVD present. No  tracheal deviation present or significant neck LA or mass Cardiovascular: Normal rate, regular rhythm, normal heart sounds and intact distal pulses.   Pulmonary/Chest: WOB normal and breath sounds without rales or wheezing  Abdominal: Soft. Bowel sounds are normal. NT. No HSM  Musculoskeletal: Normal range of motion. Exhibits no edema Lymphadenopathy: Has no other cervical adenopathy.  Neurological: Pt is alert and oriented to person, place, and time. Pt has normal reflexes. No cranial nerve deficit. Motor grossly intact, Gait intact Skin: Skin is warm and dry. No rash noted or new ulcerations Psychiatric:  Has normal mood and affect. Behavior is normal without agitation No other exam findings Lab Results  Component Value Date   WBC 8.0 07/08/2019   HGB 15.0 07/08/2019   HCT 43.0 07/08/2019   PLT 342.0 07/08/2019   GLUCOSE 123 (H) 07/08/2019   CHOL 165 07/08/2019   TRIG 105.0 07/08/2019   HDL 55.10 07/08/2019   LDLDIRECT 112.5 08/16/2009   LDLCALC 89 07/08/2019   ALT 24 07/08/2019   AST 22 07/08/2019   NA 136 07/08/2019   K 3.9 07/08/2019   CL 100 07/08/2019   CREATININE 0.92 07/08/2019   BUN 15 07/08/2019   CO2 26 07/08/2019   TSH 2.23 07/08/2019   PSA 1.09 07/08/2019   HGBA1C 5.6 07/08/2019      Assessment & Plan:

## 2019-07-24 NOTE — Patient Instructions (Addendum)
You had the flu shot today  Please continue all other medications as before, and refills have been done if requested.  Please have the pharmacy call with any other refills you may need.  Please continue your efforts at being more active, low cholesterol diet, and weight control.  You are otherwise up to date with prevention measures today.  Please keep your appointments with your specialists as you may have planned  Please call if you feel having the colonoscopy in the next year is appropriate given your other health concerns for now  Please return in 6 months, or sooner if needed

## 2019-07-24 NOTE — Assessment & Plan Note (Signed)
stable overall by history and exam, recent data reviewed with pt, and pt to continue medical treatment as before,  to f/u any worsening symptoms or concerns  

## 2019-08-04 ENCOUNTER — Other Ambulatory Visit (HOSPITAL_COMMUNITY): Payer: BLUE CROSS/BLUE SHIELD

## 2019-08-07 ENCOUNTER — Encounter (HOSPITAL_COMMUNITY): Payer: Self-pay

## 2019-08-07 ENCOUNTER — Ambulatory Visit (HOSPITAL_COMMUNITY): Admit: 2019-08-07 | Payer: BLUE CROSS/BLUE SHIELD | Admitting: Gastroenterology

## 2019-08-07 SURGERY — UPPER ENDOSCOPIC ULTRASOUND (EUS) RADIAL
Anesthesia: Monitor Anesthesia Care

## 2019-08-12 DIAGNOSIS — C250 Malignant neoplasm of head of pancreas: Secondary | ICD-10-CM

## 2019-08-12 HISTORY — DX: Malignant neoplasm of head of pancreas: C25.0

## 2019-08-13 MED ORDER — KCL IN DEXTROSE-NACL 20-5-0.45 MEQ/L-%-% IV SOLN
75.00 | INTRAVENOUS | Status: DC
Start: ? — End: 2019-08-13

## 2019-08-13 MED ORDER — MAGNESIUM HYDROXIDE 400 MG/5ML PO SUSP
30.00 | ORAL | Status: DC
Start: 2019-08-13 — End: 2019-08-13

## 2019-08-13 MED ORDER — GENERIC EXTERNAL MEDICATION
40.00 | Status: DC
Start: 2019-08-16 — End: 2019-08-13

## 2019-08-13 MED ORDER — GENERIC EXTERNAL MEDICATION
12.50 | Status: DC
Start: ? — End: 2019-08-13

## 2019-08-13 MED ORDER — CELECOXIB 100 MG PO CAPS
100.00 | ORAL_CAPSULE | ORAL | Status: DC
Start: 2019-08-15 — End: 2019-08-13

## 2019-08-13 MED ORDER — GENERIC EXTERNAL MEDICATION
Status: DC
Start: ? — End: 2019-08-13

## 2019-08-13 MED ORDER — ACETAMINOPHEN 325 MG PO TABS
650.00 | ORAL_TABLET | ORAL | Status: DC
Start: 2019-08-15 — End: 2019-08-13

## 2019-08-13 MED ORDER — DOCUSATE SODIUM 100 MG PO CAPS
100.00 | ORAL_CAPSULE | ORAL | Status: DC
Start: 2019-08-15 — End: 2019-08-13

## 2019-08-13 MED ORDER — GENERIC EXTERNAL MEDICATION
0.75 | Status: DC
Start: ? — End: 2019-08-13

## 2019-08-13 MED ORDER — LIDOCAINE HCL 1 % IJ SOLN
0.50 | INTRAMUSCULAR | Status: DC
Start: ? — End: 2019-08-13

## 2019-08-13 MED ORDER — TAMSULOSIN HCL 0.4 MG PO CAPS
0.40 | ORAL_CAPSULE | ORAL | Status: DC
Start: 2019-08-16 — End: 2019-08-13

## 2019-08-13 MED ORDER — HEPARIN SODIUM (PORCINE) 5000 UNIT/ML IJ SOLN
5000.00 | INTRAMUSCULAR | Status: DC
Start: 2019-08-15 — End: 2019-08-13

## 2019-08-13 MED ORDER — ONDANSETRON HCL 4 MG/2ML IJ SOLN
4.00 | INTRAMUSCULAR | Status: DC
Start: ? — End: 2019-08-13

## 2019-08-15 MED ORDER — GENERIC EXTERNAL MEDICATION
Status: DC
Start: ? — End: 2019-08-15

## 2019-08-15 MED ORDER — GENERIC EXTERNAL MEDICATION
0.25 | Status: DC
Start: ? — End: 2019-08-15

## 2019-08-15 MED ORDER — OXYCODONE HCL 5 MG PO TABS
5.00 | ORAL_TABLET | ORAL | Status: DC
Start: ? — End: 2019-08-15

## 2019-10-07 ENCOUNTER — Encounter: Payer: Self-pay | Admitting: Gastroenterology

## 2019-12-16 DIAGNOSIS — N3289 Other specified disorders of bladder: Secondary | ICD-10-CM | POA: Insufficient documentation

## 2020-01-21 ENCOUNTER — Encounter: Payer: Self-pay | Admitting: Internal Medicine

## 2020-01-21 ENCOUNTER — Other Ambulatory Visit: Payer: Self-pay

## 2020-01-21 ENCOUNTER — Ambulatory Visit: Payer: BC Managed Care – PPO | Admitting: Internal Medicine

## 2020-01-21 ENCOUNTER — Ambulatory Visit (INDEPENDENT_AMBULATORY_CARE_PROVIDER_SITE_OTHER): Payer: BLUE CROSS/BLUE SHIELD | Admitting: Internal Medicine

## 2020-01-21 VITALS — BP 122/70 | Temp 98.5°F | Ht 69.0 in | Wt 157.4 lb

## 2020-01-21 DIAGNOSIS — R739 Hyperglycemia, unspecified: Secondary | ICD-10-CM

## 2020-01-21 DIAGNOSIS — N400 Enlarged prostate without lower urinary tract symptoms: Secondary | ICD-10-CM | POA: Insufficient documentation

## 2020-01-21 DIAGNOSIS — Z0000 Encounter for general adult medical examination without abnormal findings: Secondary | ICD-10-CM

## 2020-01-21 DIAGNOSIS — C250 Malignant neoplasm of head of pancreas: Secondary | ICD-10-CM | POA: Diagnosis not present

## 2020-01-21 LAB — POCT GLYCOSYLATED HEMOGLOBIN (HGB A1C)
HbA1c POC (<> result, manual entry): 5.6 % (ref 4.0–5.6)
HbA1c, POC (controlled diabetic range): 5.6 % (ref 0.0–7.0)
HbA1c, POC (prediabetic range): 5.6 % — AB (ref 5.7–6.4)
Hemoglobin A1C: 5.6 % (ref 4.0–5.6)

## 2020-01-21 NOTE — Assessment & Plan Note (Signed)
For f/u oncology

## 2020-01-21 NOTE — Assessment & Plan Note (Signed)

## 2020-01-21 NOTE — Assessment & Plan Note (Signed)
stable overall by history and exam, recent data reviewed with pt, and pt to continue medical treatment as before,  to f/u any worsening symptoms or concerns  

## 2020-01-21 NOTE — Progress Notes (Signed)
Subjective:    Patient ID: Micheal Guerrero, male    DOB: 1956/05/17, 64 y.o.   MRN: [id number]  HPI  Here for wellness and f/u;  Overall doing ok;  Pt denies Chest pain, worsening SOB, DOE, wheezing, orthopnea, PND, worsening LE edema, palpitations, dizziness or syncope.  Pt denies neurological change such as new headache, facial or extremity weakness.  Pt denies polydipsia, polyuria, or low sugar symptoms. Pt states overall good compliance with treatment and medications, good tolerability, and has been trying to follow appropriate diet.  Pt denies worsening depressive symptoms, suicidal ideation or panic. No fever, night sweats, wt loss, loss of appetite, or other constitutional symptoms.  Pt states good ability with ADL's, has low fall risk, home safety reviewed and adequate, no other significant changes in hearing or vision, and only occasionally active with exercise. S/p EUS x 2 at [facility] with second one "suspicous" so s/p whipple procedure, d/c after 2.5 days, pathology c/w pancreatic adenoca, also seen per oncology, told he had 50/50 chance of survival at 64 yo, offered folfirinox now s/p 9 treatments, 3 tx to go, with side effects mild without GI side effects, neulastin working well for neutrophils, LFTs "ok", mild anemia only, and alk phos slight high only per pt report.  Has gained some with better eating.   Past Medical History:  Diagnosis Date  . Cancer of head of pancreas ([place]) 08/12/2019  . DIVERTICULOSIS, COLON 08/19/2009  . HYPERLIPIDEMIA 08/19/2009  . LEG CRAMPS 08/19/2009   Past Surgical History:  Procedure Laterality Date  . s/p bakers cyst knee      @ age 55  . VASECTOMY      reports that he has quit smoking. He has never used smokeless tobacco. He reports current alcohol use. He reports current drug use. family history includes Dementia in his father. No Known Allergies Current Outpatient Medications on File Prior to Visit  Medication Sig Dispense Refill  . dexamethasone  (DECADRON) 4 MG tablet     . dexamethasone (DECADRON) 4 MG tablet Take 67m (2 x 420mtablets) by mouth in the morning for 2 days after the first day of each cycle, then as directed.    . Mariane Baumgartenodium (DSS) 100 MG CAPS Take by mouth.    . enoxaparin (LOVENOX) 40 MG/0.4ML injection     . Loratadine 10 MG CAPS Take by mouth.    . ondansetron (ZOFRAN-ODT) 4 MG disintegrating tablet     . Vitamin D, Ergocalciferol, (DRISDOL) 1.25 MG (50000 UT) CAPS capsule Take 1 capsule (50,000 Units total) by mouth every 7 (seven) days. 12 capsule 0  . pantoprazole (PROTONIX) 40 MG tablet Take by mouth.     No current facility-administered medications on file prior to visit.   Review of Systems All otherwise neg per pt     Objective:   Physical Exam BP 122/70 (BP Location: Left Arm, Patient Position: Sitting, Cuff Size: Normal)   Temp 98.5 F (36.9 C) (Oral)   Ht 5' 9"  (1.753 m)   Wt 157 lb 6.4 oz (71.4 kg)   BMI 23.24 kg/m  VS noted,  Constitutional: Pt appears in NAD HENT: Head: NCAT.  Right Ear: External ear normal.  Left Ear: External ear normal.  Eyes: . Pupils are equal, round, and reactive to light. Conjunctivae and EOM are normal Nose: without d/c or deformity Neck: Neck supple. Gross normal ROM Cardiovascular: Normal rate and regular rhythm.   Pulmonary/Chest: Effort normal and breath sounds without rales or  wheezing.  Abd:  Soft, NT, ND, + BS, no organomegaly Neurological: Pt is alert. At baseline orientation, motor grossly intact Skin: Skin is warm. No rashes, other new lesions, no LE edema Psychiatric: Pt behavior is normal without agitation  All otherwise neg per pt Lab Results  Component Value Date   WBC 8.0 07/08/2019   HGB 15.0 07/08/2019   HCT 43.0 07/08/2019   PLT 342.0 07/08/2019   GLUCOSE 123 (H) 07/08/2019   CHOL 165 07/08/2019   TRIG 105.0 07/08/2019   HDL 55.10 07/08/2019   LDLDIRECT 112.5 08/16/2009   LDLCALC 89 07/08/2019   ALT 24 07/08/2019   AST 22  07/08/2019   NA 136 07/08/2019   K 3.9 07/08/2019   CL 100 07/08/2019   CREATININE 0.92 07/08/2019   BUN 15 07/08/2019   CO2 26 07/08/2019   TSH 2.23 07/08/2019   PSA 1.09 07/08/2019   HGBA1C 5.6 01/21/2020   HGBA1C 5.6 01/21/2020   HGBA1C 5.6 (A) 01/21/2020   HGBA1C 5.6 01/21/2020         Assessment & Plan:

## 2020-01-21 NOTE — Patient Instructions (Signed)
Your A1c was normal today  Please continue all other medications as before, and refills have been done if requested.  Please have the pharmacy call with any other refills you may need.  Please continue your efforts at being more active, low cholesterol diet, and weight control.  You are otherwise up to date with prevention measures today.  Please keep your appointments with your specialists as you may have planned  Please make an Appointment to return in 6 months, or sooner if needed

## 2020-06-10 ENCOUNTER — Telehealth: Payer: Self-pay | Admitting: Internal Medicine

## 2020-06-10 NOTE — Telephone Encounter (Signed)
Sorry, this is out of my scope of practice as I have never done this before

## 2020-06-10 NOTE — Telephone Encounter (Signed)
New message:   Pt is calling and states he is needing a order to get a port flush done in [place]. He states he [facility] the [facility] and they stated he had to have a physicians order. Please advise.

## 2020-06-10 NOTE — Telephone Encounter (Signed)
Sent to Dr. John. 

## 2020-06-11 ENCOUNTER — Encounter: Payer: Self-pay | Admitting: Internal Medicine

## 2020-06-11 NOTE — Telephone Encounter (Signed)
New message:   Pt is calling and states he never received a message in regards to Dr. Gwynn Burly decision about ordering him a port flush. Pt is very upset and would like a call back today. Please advise.

## 2020-06-11 NOTE — Telephone Encounter (Signed)
Called pt LDVM of Dr. Judi Cong note and if she has any concerns or questions to call the office.

## 2020-06-11 NOTE — Telephone Encounter (Signed)
Spoke with pt and informed him of Dr. Gwynn Burly note. Pt stated this is stupid and poor pt care. Pt states he will be witting the higher up's on this matter due to him knowing we have plenty of port flush nurses.  **I have sent Dr. Jenny Reichmann a note of this as well.

## 2020-06-11 NOTE — Telephone Encounter (Addendum)
    Patient called angry, states he doesn't understand why Dr Jenny Reichmann will not order port flush. Patient wants call back today

## 2020-06-11 NOTE — Telephone Encounter (Signed)
Same answer as this is not in my scope of care  Perhaps I can refer him to general surgury, but I normally have nothing to do with any patient port care for the last 26 years

## 2020-06-14 NOTE — Telephone Encounter (Signed)
Faxed and received by Otilio Saber at [facility] and faxed [phone].

## 2020-07-20 ENCOUNTER — Ambulatory Visit (INDEPENDENT_AMBULATORY_CARE_PROVIDER_SITE_OTHER): Payer: BC Managed Care – PPO | Admitting: Internal Medicine

## 2020-07-20 ENCOUNTER — Other Ambulatory Visit: Payer: Self-pay

## 2020-07-20 ENCOUNTER — Encounter: Payer: Self-pay | Admitting: Internal Medicine

## 2020-07-20 VITALS — BP 118/72 | HR 62 | Temp 98.0°F | Wt 156.4 lb

## 2020-07-20 DIAGNOSIS — C250 Malignant neoplasm of head of pancreas: Secondary | ICD-10-CM | POA: Diagnosis not present

## 2020-07-20 DIAGNOSIS — R739 Hyperglycemia, unspecified: Secondary | ICD-10-CM

## 2020-07-20 DIAGNOSIS — E538 Deficiency of other specified B group vitamins: Secondary | ICD-10-CM

## 2020-07-20 DIAGNOSIS — E559 Vitamin D deficiency, unspecified: Secondary | ICD-10-CM

## 2020-07-20 DIAGNOSIS — Z0000 Encounter for general adult medical examination without abnormal findings: Secondary | ICD-10-CM | POA: Diagnosis not present

## 2020-07-20 NOTE — Assessment & Plan Note (Signed)
In remission, cont to f/u [facility]

## 2020-07-20 NOTE — Assessment & Plan Note (Signed)
stable overall by history and exam, recent data reviewed with pt, and pt to continue medical treatment as before,  to f/u any worsening symptoms or concerns  

## 2020-07-20 NOTE — Progress Notes (Signed)
Subjective:    Patient ID: Micheal Guerrero, male    DOB: 23-Sep-1956, 64 y.o.   MRN: [id number]  HPI  Here for wellness and f/u;  Overall doing ok;  Pt denies Chest pain, worsening SOB, DOE, wheezing, orthopnea, PND, worsening LE edema, palpitations, dizziness or syncope.  Pt denies neurological change such as new headache, facial or extremity weakness.  Pt denies polydipsia, polyuria, or low sugar symptoms. Pt states overall good compliance with treatment and medications, good tolerability, and has been trying to follow appropriate diet.  Pt denies worsening depressive symptoms, suicidal ideation or panic. No fever, night sweats, wt loss, loss of appetite, or other constitutional symptoms.  Pt states good ability with ADL's, has low fall risk, home safety reviewed and adequate, no other significant changes in hearing or vision, and only occasionally active with exercise.  Due now for colonoscopy.   Now 1 yr s/p pancreatic ca tx with surgury and chemo and no recurrence on lab and imaging.   Wt Readings from Last 3 Encounters:  07/20/20 156 lb 6.4 oz (70.9 kg)  01/21/20 157 lb 6.4 oz (71.4 kg)  07/24/19 153 lb (69.4 kg)   Past Medical History:  Diagnosis Date  . Cancer of head of pancreas ([place]) 08/12/2019  . DIVERTICULOSIS, COLON 08/19/2009  . HYPERLIPIDEMIA 08/19/2009  . LEG CRAMPS 08/19/2009   Past Surgical History:  Procedure Laterality Date  . s/p bakers cyst knee      @ age 17  . VASECTOMY      reports that he has quit smoking. He has never used smokeless tobacco. He reports current alcohol use. He reports current drug use. family history includes Dementia in his father. No Known Allergies Current Outpatient Medications on File Prior to Visit  Medication Sig Dispense Refill  . pantoprazole (PROTONIX) 40 MG tablet Take by mouth.     No current facility-administered medications on file prior to visit.   Review of Systems All otherwise neg per pt     Objective:   Physical  Exam BP 118/72 (BP Location: Left Arm)   Pulse 62   Temp 98 F (36.7 C) (Oral)   Wt 156 lb 6.4 oz (70.9 kg)   SpO2 97%   BMI 23.10 kg/m  VS noted,  Constitutional: Pt appears in NAD HENT: Head: NCAT.  Right Ear: External ear normal.  Left Ear: External ear normal.  Eyes: . Pupils are equal, round, and reactive to light. Conjunctivae and EOM are normal Nose: without d/c or deformity Neck: Neck supple. Gross normal ROM Cardiovascular: Normal rate and regular rhythm.   Pulmonary/Chest: Effort normal and breath sounds without rales or wheezing.  Abd:  Soft, NT, ND, + BS, no organomegaly Neurological: Pt is alert. At baseline orientation, motor grossly intact Skin: Skin is warm. No rashes, other new lesions, no LE edema Psychiatric: Pt behavior is normal without agitation  All otherwise neg per pt Lab Results  Component Value Date   WBC 8.0 07/08/2019   HGB 15.0 07/08/2019   HCT 43.0 07/08/2019   PLT 342.0 07/08/2019   GLUCOSE 123 (H) 07/08/2019   CHOL 165 07/08/2019   TRIG 105.0 07/08/2019   HDL 55.10 07/08/2019   LDLDIRECT 112.5 08/16/2009   LDLCALC 89 07/08/2019   ALT 24 07/08/2019   AST 22 07/08/2019   NA 136 07/08/2019   K 3.9 07/08/2019   CL 100 07/08/2019   CREATININE 0.92 07/08/2019   BUN 15 07/08/2019   CO2 26 07/08/2019  TSH 2.23 07/08/2019   PSA 1.09 07/08/2019   HGBA1C 5.6 01/21/2020   HGBA1C 5.6 01/21/2020   HGBA1C 5.6 (A) 01/21/2020   HGBA1C 5.6 01/21/2020      Assessment & Plan:

## 2020-07-20 NOTE — Assessment & Plan Note (Signed)

## 2020-07-20 NOTE — Patient Instructions (Addendum)
Please call for scheduling the colonoscopy  Please continue all other medications as before, and refills have been done if requested.  Please have the pharmacy call with any other refills you may need.  Please continue your efforts at being more active, low cholesterol diet, and weight control.  You are otherwise up to date with prevention measures today.  Please keep your appointments with your specialists as you may have planned  Please go to the LAB at the blood drawing area for the tests to be done  You will be contacted by phone if any changes need to be made immediately.  Otherwise, you will receive a letter about your results with an explanation, but please check with MyChart first.  Please remember to sign up for MyChart if you have not done so, as this will be important to you in the future with finding out test results, communicating by private email, and scheduling acute appointments online when needed.  Please make an Appointment to return for your 1 year visit, or sooner if needed, with Lab testing by Appointment as well, to be done about 3-5 days before at the [facility] (so this is for TWO appointments - please see the scheduling desk as you leave)

## 2020-07-20 NOTE — Addendum Note (Signed)
Addended by: Cresenciano Lick on: 07/20/2020 09:38 AM   Modules accepted: Orders

## 2020-07-21 ENCOUNTER — Encounter: Payer: Self-pay | Admitting: Internal Medicine

## 2020-07-21 LAB — LIPID PANEL
Cholesterol: 228 mg/dL — ABNORMAL HIGH (ref <200)
HDL: 50 mg/dL (ref >=40)
LDL Cholesterol (Calc): 151 mg/dL (calc) — ABNORMAL HIGH
Non-HDL Cholesterol (Calc): 178 mg/dL (calc) — ABNORMAL HIGH (ref <130)
Total CHOL/HDL Ratio: 4.6 (calc) (ref <5.0)
Triglycerides: 142 mg/dL (ref <150)

## 2020-07-21 LAB — CBC WITH DIFFERENTIAL/PLATELET
Absolute Monocytes: 409 cells/uL (ref 200–950)
Basophils Absolute: 28 cells/uL (ref 0–200)
Basophils Relative: 0.6 %
Eosinophils Absolute: 60 cells/uL (ref 15–500)
Eosinophils Relative: 1.3 %
HCT: 41.5 % (ref 38.5–50.0)
Hemoglobin: 14 g/dL (ref 13.2–17.1)
Lymphs Abs: 1688 cells/uL (ref 850–3900)
MCH: 30.6 pg (ref 27.0–33.0)
MCHC: 33.7 g/dL (ref 32.0–36.0)
MCV: 90.6 fL (ref 80.0–100.0)
MPV: 8.7 fL (ref 7.5–12.5)
Monocytes Relative: 8.9 %
Neutro Abs: 2415 cells/uL (ref 1500–7800)
Neutrophils Relative %: 52.5 %
Platelets: 236 10*3/uL (ref 140–400)
RBC: 4.58 10*6/uL (ref 4.20–5.80)
RDW: 13.1 % (ref 11.0–15.0)
Total Lymphocyte: 36.7 %
WBC: 4.6 10*3/uL (ref 3.8–10.8)

## 2020-07-21 LAB — COMPLETE METABOLIC PANEL WITH GFR
AG Ratio: 2.1 (calc) (ref 1.0–2.5)
ALT: 39 U/L (ref 9–46)
AST: 29 U/L (ref 10–35)
Albumin: 4.4 g/dL (ref 3.6–5.1)
Alkaline phosphatase (APISO): 93 U/L (ref 35–144)
BUN: 16 mg/dL (ref 7–25)
CO2: 30 mmol/L (ref 20–32)
Calcium: 9.5 mg/dL (ref 8.6–10.3)
Chloride: 104 mmol/L (ref 98–110)
Creat: 0.97 mg/dL (ref 0.70–1.25)
GFR, Est African American: 96 mL/min/{1.73_m2} (ref >=60)
GFR, Est Non African American: 83 mL/min/{1.73_m2} (ref >=60)
Globulin: 2.1 g/dL (calc) (ref 1.9–3.7)
Glucose, Bld: 109 mg/dL — ABNORMAL HIGH (ref 65–99)
Potassium: 4.5 mmol/L (ref 3.5–5.3)
Sodium: 139 mmol/L (ref 135–146)
Total Bilirubin: 0.9 mg/dL (ref 0.2–1.2)
Total Protein: 6.5 g/dL (ref 6.1–8.1)

## 2020-07-21 LAB — URINALYSIS, ROUTINE W REFLEX MICROSCOPIC
Bilirubin Urine: NEGATIVE
Glucose, UA: NEGATIVE
Hgb urine dipstick: NEGATIVE
Ketones, ur: NEGATIVE
Leukocytes,Ua: NEGATIVE
Nitrite: NEGATIVE
Protein, ur: NEGATIVE
Specific Gravity, Urine: 1.019 (ref 1.001–1.03)
pH: 5.5 (ref 5.0–8.0)

## 2020-07-21 LAB — HEMOGLOBIN A1C
Hgb A1c MFr Bld: 5.5 % of total Hgb (ref <5.7)
Mean Plasma Glucose: 111 (calc)
eAG (mmol/L): 6.2 (calc)

## 2020-07-21 LAB — PSA: PSA: 0.9 ng/mL (ref <=4.0)

## 2020-07-21 LAB — TSH: TSH: 3.15 mIU/L (ref 0.40–4.50)

## 2020-07-21 LAB — VITAMIN B12: Vitamin B-12: 374 pg/mL (ref 200–1100)

## 2020-07-21 LAB — VITAMIN D 25 HYDROXY (VIT D DEFICIENCY, FRACTURES): Vit D, 25-Hydroxy: 14 ng/mL — ABNORMAL LOW (ref 30–100)

## 2020-07-24 NOTE — Addendum Note (Signed)
Addended by: Biagio Borg on: 07/24/2020 09:53 PM   Modules accepted: Orders

## 2020-08-30 ENCOUNTER — Telehealth: Payer: Self-pay | Admitting: Internal Medicine

## 2020-08-30 NOTE — Telephone Encounter (Signed)
Patient sent to [facility] for pain and numbness in left hand  Advised to go to ED or urgent care, patient refused  Appointment made on 10.1.21 with L. Valere Dross

## 2020-09-03 ENCOUNTER — Ambulatory Visit (INDEPENDENT_AMBULATORY_CARE_PROVIDER_SITE_OTHER): Payer: BC Managed Care – PPO

## 2020-09-03 ENCOUNTER — Encounter: Payer: Self-pay | Admitting: Family

## 2020-09-03 ENCOUNTER — Other Ambulatory Visit: Payer: Self-pay

## 2020-09-03 ENCOUNTER — Ambulatory Visit (INDEPENDENT_AMBULATORY_CARE_PROVIDER_SITE_OTHER): Payer: BC Managed Care – PPO | Admitting: Family

## 2020-09-03 VITALS — BP 108/64 | HR 54 | Temp 98.0°F | Ht 69.0 in | Wt 157.8 lb

## 2020-09-03 DIAGNOSIS — G8929 Other chronic pain: Secondary | ICD-10-CM | POA: Diagnosis not present

## 2020-09-03 DIAGNOSIS — Z23 Encounter for immunization: Secondary | ICD-10-CM | POA: Diagnosis not present

## 2020-09-03 DIAGNOSIS — M25512 Pain in left shoulder: Secondary | ICD-10-CM

## 2020-09-03 DIAGNOSIS — R200 Anesthesia of skin: Secondary | ICD-10-CM | POA: Diagnosis not present

## 2020-09-03 DIAGNOSIS — M542 Cervicalgia: Secondary | ICD-10-CM

## 2020-09-03 MED ORDER — PREDNISONE 20 MG PO TABS: 40.0000 mg | ORAL_TABLET | Freq: Every day | ORAL | 0 refills | Status: DC

## 2020-09-03 NOTE — Progress Notes (Signed)
Micheal Guerrero is a 64 y.o. male with the following history as recorded in EpicCare:  Patient Active Problem List   Diagnosis Date Noted  . BPH (benign prostatic hyperplasia) 01/21/2020  . Cancer of head of pancreas ([place]) 08/12/2019  . Hyperglycemia 07/22/2018  . Elevated systolic blood pressure reading with diagnosis of hypertension 04/29/2017  . 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 Capsulitis of foot 07/19/2015  . Foot pain, bilateral 07/15/2015  . Insomnia 12/21/2011  . Preventative health care 12/16/2011  . Hyperlipidemia 08/19/2009  . DIVERTICULOSIS, COLON 08/19/2009  . LEG CRAMPS 08/19/2009    Current Outpatient Medications  Medication Sig Dispense Refill  . pantoprazole (PROTONIX) 40 MG tablet Take by mouth.    . predniSONE (DELTASONE) 20 MG tablet Take 2 tablets (40 mg total) by mouth daily with breakfast. 10 tablet 0   No current facility-administered medications for this visit.    Allergies: Patient has no known allergies.  Past Medical History:  Diagnosis Date  . Cancer of head of pancreas ([place]) 08/12/2019  . DIVERTICULOSIS, COLON 08/19/2009  . HYPERLIPIDEMIA 08/19/2009  . LEG CRAMPS 08/19/2009    Past Surgical History:  Procedure Laterality Date  . s/p bakers cyst knee      @ age 31  . VASECTOMY      Family History  Problem Relation Age of Onset  . Dementia Father     Social History   Tobacco Use  . Smoking status: Former Research scientist (life sciences)  . Smokeless tobacco: Never Used  Substance Use Topics  . Alcohol use: Yes    Comment: social    Subjective:  Left hand pain and numbness x 1 week; notes that symptoms were waking him up on Sunday evening; also mentions numbness in tricep in left arm; has tried using wrist splint with limited benefit;  Did take chemotherapy in the past year- has had some numbness in his fingertips since then but feels these specific symptoms are "very different."  Objective:  Vitals:   09/03/20 1004  BP: 108/64   Pulse: (!) 54  Temp: 98 F (36.7 C)  TempSrc: Oral  SpO2: 98%  Weight: 157 lb 12.8 oz (71.6 kg)  Height: 5\' 9"  (1.753 m)    General: Well developed, well nourished, in no acute distress  Head: Normocephalic and atraumatic  Lungs: Respirations unlabored; clear to auscultation bilaterally without wheeze, rales, rhonchi  Musculoskeletal: No deformities; no active joint inflammation  Extremities: No edema, cyanosis, clubbing  Vessels: Symmetric bilaterally  Neurologic: Alert and oriented; speech intact; face symmetrical; moves all extremities well; CNII-XII intact without focal deficit   Assessment:  1. Numbness of left hand   2. Neck pain   3. Chronic left shoulder pain   4. Needs flu shot     Plan:  1. ? Carpal tunnel; refer to orthopedist; Rx for Prednisone given- patient will hold and use only if needed; 2. Update cervical X-ray; 3. Update left shoulder x-ray; 4. Flu shot given;   This visit occurred during the SARS-CoV-2 public health emergency.  Safety protocols were in place, including screening questions prior to the visit, additional usage of staff PPE, and extensive cleaning of exam room while observing appropriate contact time as indicated for disinfecting solutions.     No follow-ups on file.  Orders Placed This Encounter  Procedures  . DG Cervical Spine 2 or 3 views    Standing Status:   Future    Number of Occurrences:   1  Standing Expiration Date:   09/03/2021    Order Specific Question:   Reason for Exam (SYMPTOM  OR DIAGNOSIS REQUIRED)    Answer:   neck pain    Order Specific Question:   Preferred imaging location?    Answer:   [facility]  . DG Shoulder Left    Standing Status:   Future    Number of Occurrences:   1    Standing Expiration Date:   09/03/2021    Order Specific Question:   Reason for Exam (SYMPTOM  OR DIAGNOSIS REQUIRED)    Answer:   left shoulder pain    Order Specific Question:   Preferred imaging location?    Answer:    [facility]  . Flu Vaccine QUAD 36+ mos IM  . Ambulatory referral to Orthopedic Surgery    Referral Priority:   Routine    Referral Type:   Surgical    Referral Reason:   Specialty Services Required    Requested Specialty:   Orthopedic Surgery    Number of Visits Requested:   1  . Ambulatory referral to Orthopedic Surgery    Referral Priority:   Routine    Referral Type:   Surgical    Referral Reason:   Specialty Services Required    Requested Specialty:   Orthopedic Surgery    Number of Visits Requested:   1    Requested Prescriptions   Signed Prescriptions Disp Refills  . predniSONE (DELTASONE) 20 MG tablet 10 tablet 0    Sig: Take 2 tablets (40 mg total) by mouth daily with breakfast.

## 2020-09-13 DIAGNOSIS — M720 Palmar fascial fibromatosis [Dupuytren]: Secondary | ICD-10-CM | POA: Insufficient documentation

## 2020-09-13 DIAGNOSIS — M79642 Pain in left hand: Secondary | ICD-10-CM | POA: Insufficient documentation

## 2020-09-13 DIAGNOSIS — R200 Anesthesia of skin: Secondary | ICD-10-CM | POA: Insufficient documentation

## 2020-09-13 DIAGNOSIS — M79641 Pain in right hand: Secondary | ICD-10-CM | POA: Insufficient documentation

## 2020-09-14 ENCOUNTER — Encounter: Payer: Self-pay | Admitting: Neurology

## 2020-09-25 IMAGING — DX DG ABDOMEN ACUTE W/ 1V CHEST
3 series · 3 of 3 positions shown · non-contrast
Comparison: None.

CLINICAL DATA: Upper quadrant pain

EXAM:
DG ABDOMEN ACUTE W/ 1V CHEST

[chest pa]
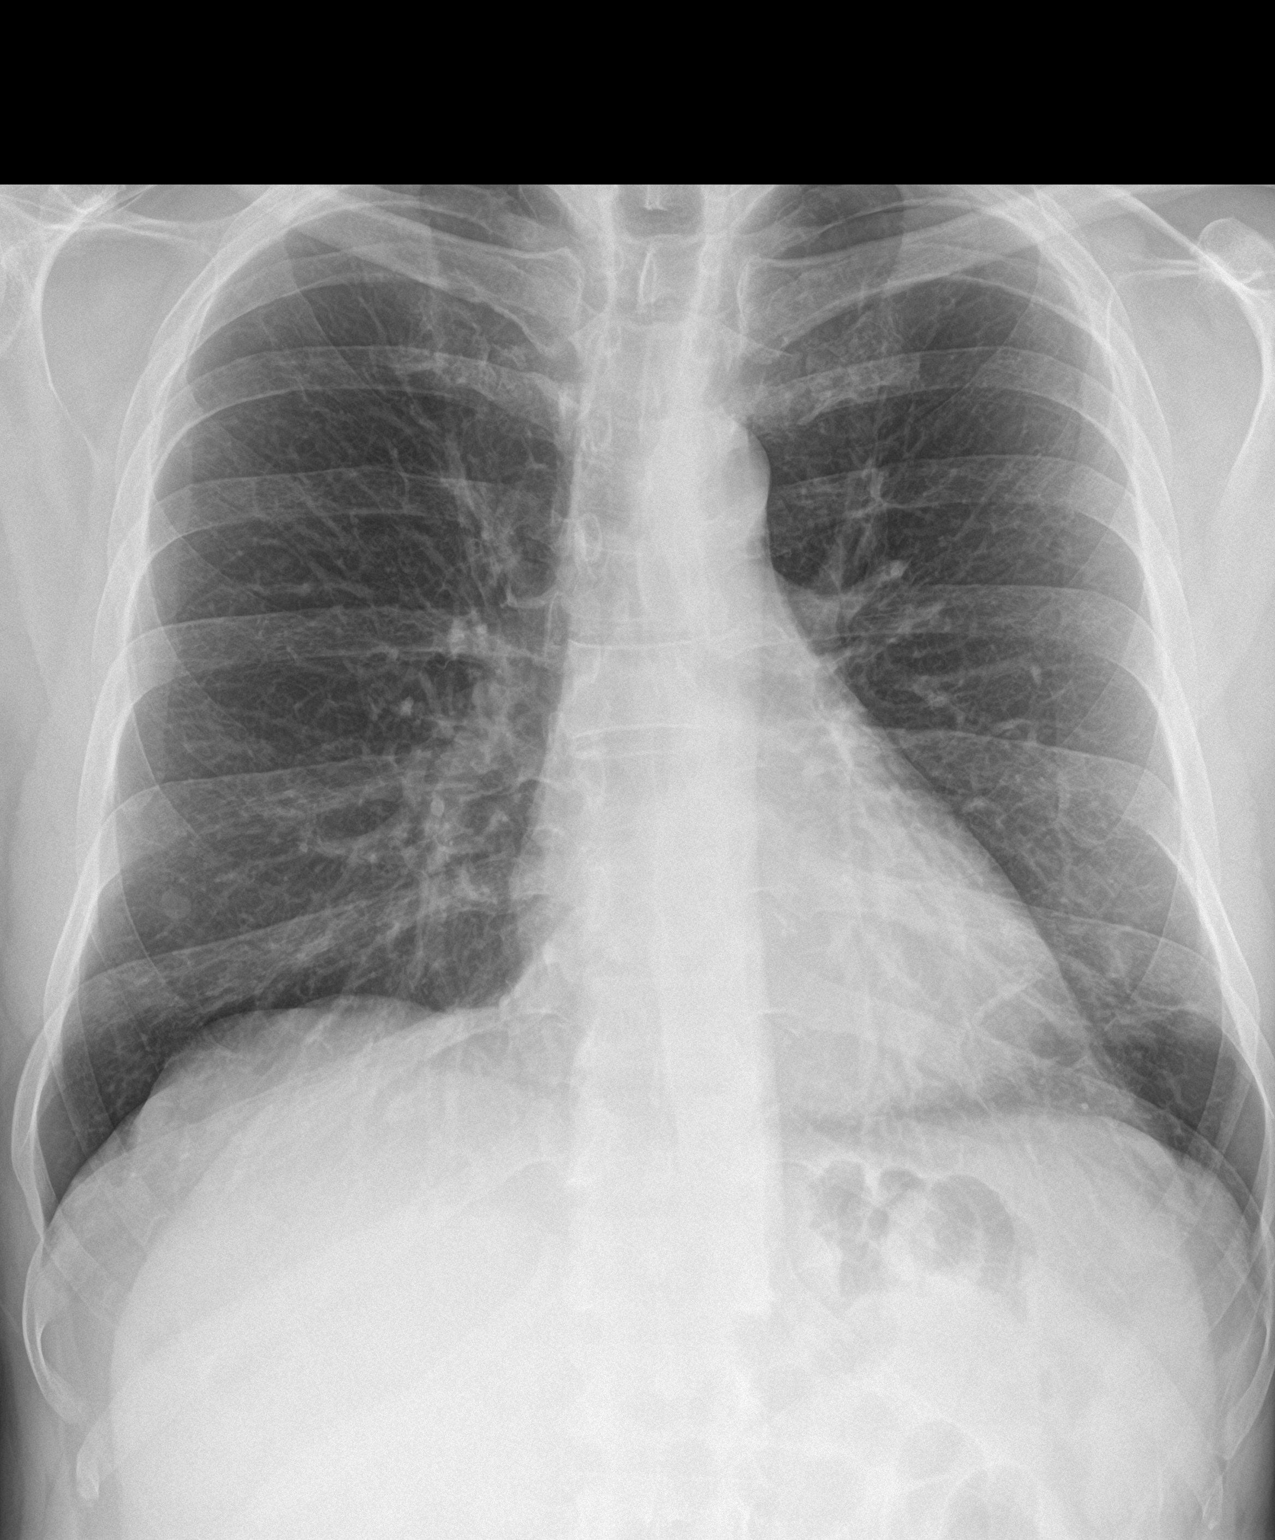

[abdomen erect]
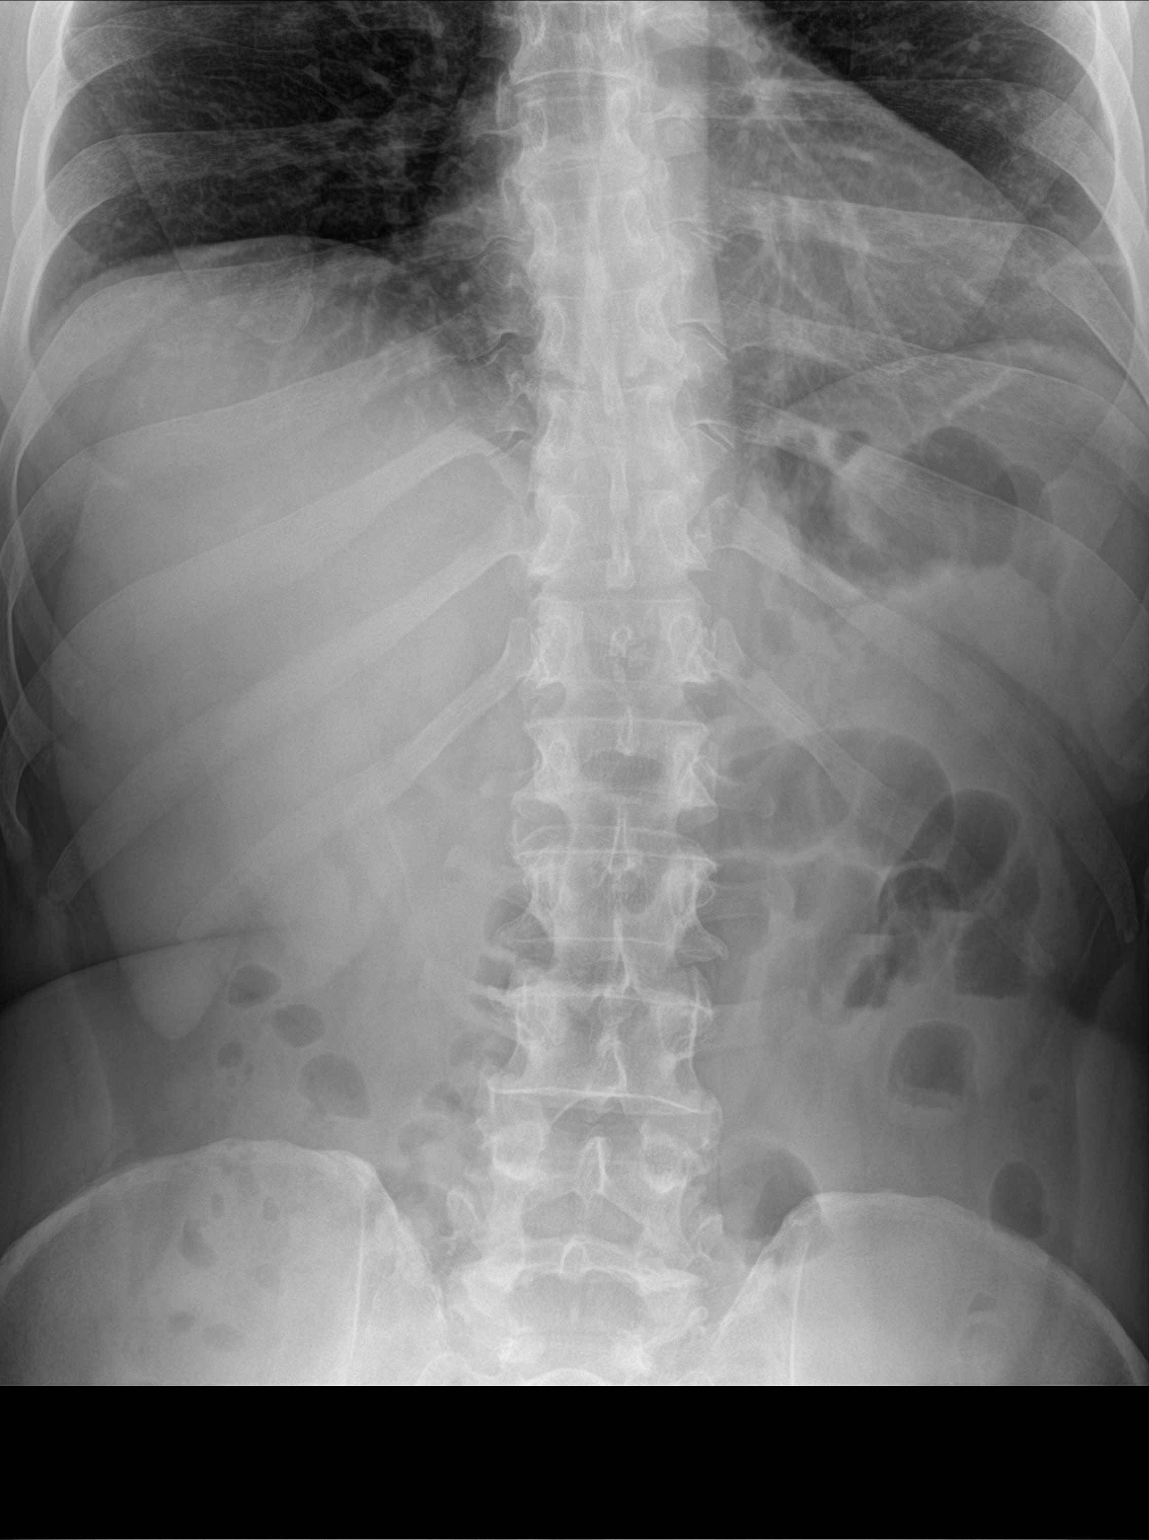

[abdomen supine]
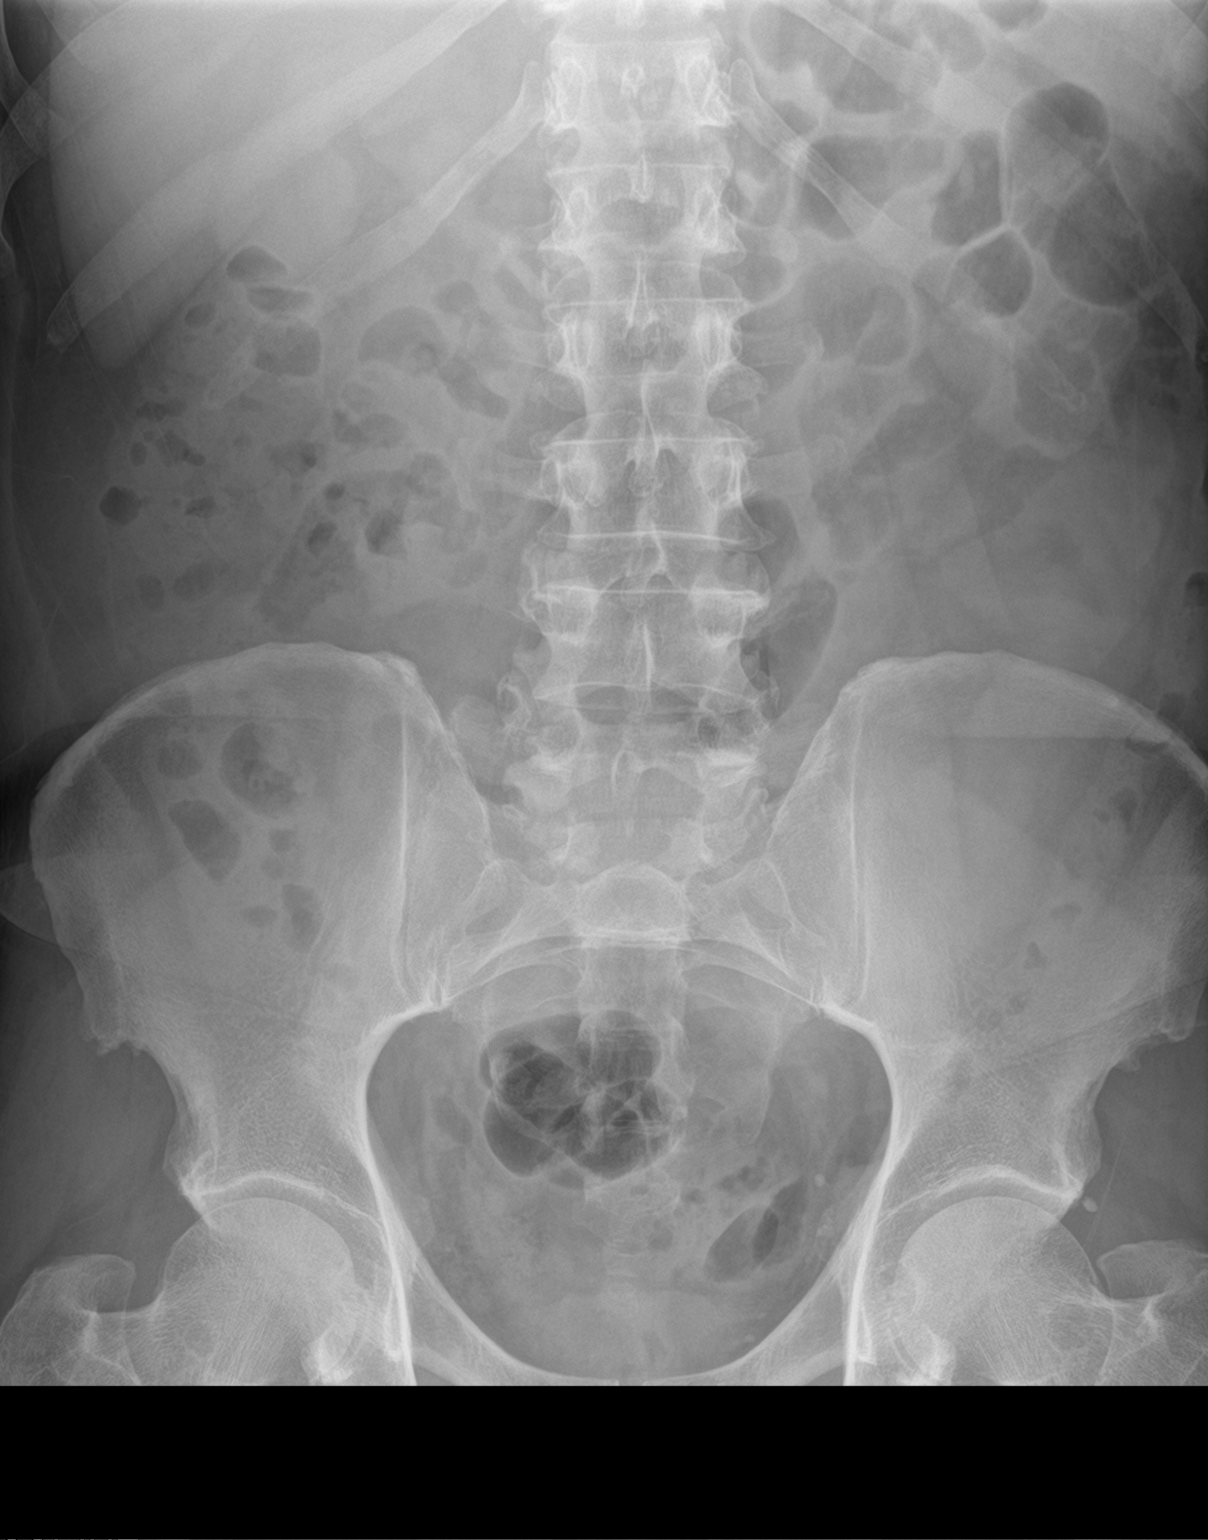

[3 of 3 positions shown; findings below may reference images not displayed]

FINDINGS: There is mildly dilated air-filled loop of small bowel seen within
the left upper quadrant measuring 2.9 cm. Air and stool seen
throughout the remainder of the bowel down to level the rectum. No
pneumoperitoneum.

There is a 7 mm rounded pulmonary nodule in the right lower lung.
The remainder of the lungs are clear. The cardiomediastinal
silhouette is unremarkable.
IMPRESSION: 1. Mildly prominent air-filled loops of small bowel in the left
upper abdomen which could represent a focal ileus.
2. No evidence of obstruction.
3. 7 mm pulmonary nodule in the right lower lung this could
represent calcified granuloma versus solid nodule. If further
evaluation is required, would recommend chest CT.

## 2020-11-02 ENCOUNTER — Encounter: Payer: BC Managed Care – PPO | Admitting: Neurology

## 2020-11-10 ENCOUNTER — Other Ambulatory Visit: Payer: Self-pay

## 2020-11-10 DIAGNOSIS — R202 Paresthesia of skin: Secondary | ICD-10-CM

## 2020-11-16 ENCOUNTER — Other Ambulatory Visit: Payer: Self-pay

## 2020-11-16 ENCOUNTER — Ambulatory Visit (INDEPENDENT_AMBULATORY_CARE_PROVIDER_SITE_OTHER): Payer: BC Managed Care – PPO | Admitting: Neurology

## 2020-11-16 DIAGNOSIS — R202 Paresthesia of skin: Secondary | ICD-10-CM | POA: Diagnosis not present

## 2020-11-16 DIAGNOSIS — G5603 Carpal tunnel syndrome, bilateral upper limbs: Secondary | ICD-10-CM

## 2020-11-16 DIAGNOSIS — G5623 Lesion of ulnar nerve, bilateral upper limbs: Secondary | ICD-10-CM

## 2020-11-16 NOTE — Procedures (Signed)
[facility]  [address]  [place], [place] [address] Tel: [phone] Fax:  [phone] Test Date:  11/16/2020  Patient: Micheal Guerrero DOB: 03/10/1956 Physician: Narda Amber, DO  Sex: Male Height: 5\' 7"  Ref Phys: Daryll Brod, MD  ID#: [id number]   Technician:    Patient Complaints: This is a 64 year old man referred for evaluation of bilateral hand paresthesias.  NCV & EMG Findings: Extensive electrodiagnostic testing of the left upper extremity and additional studies of the right shows:  1. Bilateral median sensory responses show prolonged latency (L6.2, R5.3 ms) and reduced amplitude (L5.4, R4.7 V).  Bilateral ulnar sensory responses show prolonged latency (L3.8, R4.8 ms).  Bilateral radial sensory responses are within normal limits. 2. Bilateral median motor responses show prolonged latency (L5.5, R4.8 ms).  Bilateral ulnar motor responses show slowed conduction velocity across the elbow (A Elbow-B Elbow, 43 m/s), with prolonged latency on the left (3.7 ms). 3. Chronic motor axonal loss changes are seen affecting the ulnar innervated muscles bilaterally and the left abductor pollicis brevis muscle.  There is no evidence of accompanied active denervation.   Impression: 1. Bilateral median neuropathy at or distal to the wrist, consistent with a clinical diagnosis of carpal tunnel syndrome.  Overall, these findings are severe in degree electrically and worse on the left 2. Bilateral ulnar neuropathy with slowing across the elbow, purely demyelinating, mild-to-moderate. 3. There is no definite evidence of a sensorimotor polyneuropathy affecting the upper extremities.   ___________________________ Narda Amber, DO    Nerve Conduction Studies Anti Sensory Summary Table   Stim Site NR Peak (ms) Norm Peak (ms) P-T Amp (V) Norm P-T Amp  Left Median Anti Sensory (2nd Digit)  33C  Wrist    6.2 <3.8 5.4 >10  Right Median Anti Sensory (2nd Digit)   33C  Wrist    5.3 <3.8 4.7 >10  Left Radial Anti Sensory (Base 1st Digit)  33C  Wrist    2.8 <2.8 17.5 >10  Right Radial Anti Sensory (Base 1st Digit)  33C  Wrist    2.6 <2.8 18.8 >10  Left Ulnar Anti Sensory (5th Digit)  33C  Wrist    3.8 <3.2 10.0 >5  Right Ulnar Anti Sensory (5th Digit)  33C  Wrist    4.8 <3.2 9.2 >5   Motor Summary Table   Stim Site NR Onset (ms) Norm Onset (ms) O-P Amp (mV) Norm O-P Amp Site1 Site2 Delta-0 (ms) Dist (cm) Vel (m/s) Norm Vel (m/s)  Left Median Motor (Abd Poll Brev)  33C  Wrist    5.5 <4.0 8.2 >5 Elbow Wrist 6.2 32.0 52 >50  Elbow    11.7  7.7         Right Median Motor (Abd Poll Brev)  33C  Wrist    4.8 <4.0 11.5 >5 Elbow Wrist 5.7 29.0 51 >50  Elbow    10.5  11.3         Left Ulnar Motor (Abd Dig Minimi)  33C  Wrist    3.7 <3.1 10.4 >7 B Elbow Wrist 5.6 28.0 50 >50  B Elbow    9.3  9.6  A Elbow B Elbow 2.3 10.0 43 >50  A Elbow    11.6  9.1         Right Ulnar Motor (Abd Dig Minimi)  33C  Wrist    3.0 <3.1 12.2 >7 B Elbow Wrist 4.9 26.0 53 >50  B Elbow    7.9  11.9  A Elbow B Elbow 2.3 10.0 43 >50  A Elbow    10.2  11.5          EMG   Side Muscle Ins Act Fibs Psw Fasc Number Recrt Dur Dur. Amp Amp. Poly Poly. Comment  Right 1stDorInt Nml Nml Nml Nml 1- Rapid Some 1+ Some 1+ Some 1+ N/A  Right Abd Poll Brev Nml Nml Nml Nml Nml Nml Nml Nml Nml Nml Nml Nml N/A  Right ABD Dig Min Nml Nml Nml Nml 1- Rapid Some 1+ Some 1+ Some 1+ N/A  Right PronatorTeres Nml Nml Nml Nml Nml Nml Nml Nml Nml Nml Nml Nml N/A  Right Biceps Nml Nml Nml Nml Nml Nml Nml Nml Nml Nml Nml Nml N/A  Right Triceps Nml Nml Nml Nml Nml Nml Nml Nml Nml Nml Nml Nml N/A  Right Deltoid Nml Nml Nml Nml Nml Nml Nml Nml Nml Nml Nml Nml N/A  Right FlexCarpiUln Nml Nml Nml Nml Nml Nml Nml Nml Nml Nml Nml Nml N/A  Left Abd Poll Brev Nml Nml Nml Nml 1- Rapid Some 1+ Some 1+ Some 1+ N/A  Left PronatorTeres Nml Nml Nml Nml Nml Nml Nml Nml Nml Nml Nml Nml N/A  Left Biceps Nml Nml  Nml Nml Nml Nml Nml Nml Nml Nml Nml Nml N/A  Left Triceps Nml Nml Nml Nml Nml Nml Nml Nml Nml Nml Nml Nml N/A  Left Deltoid Nml Nml Nml Nml Nml Nml Nml Nml Nml Nml Nml Nml N/A  Left ABD Dig Min Nml Nml Nml Nml 1- Rapid Some 1+ Some 1+ Some 1+ N/A  Left FlexCarpiUln Nml Nml Nml Nml Nml Nml Nml Nml Nml Nml Nml Nml N/A  Left 1stDorInt Nml Nml Nml Nml 1- Rapid Some 1+ Some 1+ Some 1+ N/A      Waveforms:

## 2020-11-29 DIAGNOSIS — G5621 Lesion of ulnar nerve, right upper limb: Secondary | ICD-10-CM | POA: Insufficient documentation

## 2020-11-29 DIAGNOSIS — G5603 Carpal tunnel syndrome, bilateral upper limbs: Secondary | ICD-10-CM | POA: Insufficient documentation

## 2020-12-08 ENCOUNTER — Encounter: Payer: BC Managed Care – PPO | Admitting: Neurology

## 2021-01-04 ENCOUNTER — Other Ambulatory Visit: Payer: Self-pay | Admitting: Orthopedic Surgery

## 2021-02-25 ENCOUNTER — Other Ambulatory Visit: Payer: Self-pay

## 2021-02-28 ENCOUNTER — Other Ambulatory Visit: Payer: Self-pay | Admitting: Internal Medicine

## 2021-02-28 ENCOUNTER — Encounter: Payer: Self-pay | Admitting: Internal Medicine

## 2021-02-28 ENCOUNTER — Other Ambulatory Visit: Payer: Self-pay

## 2021-02-28 ENCOUNTER — Ambulatory Visit (INDEPENDENT_AMBULATORY_CARE_PROVIDER_SITE_OTHER): Payer: BC Managed Care – PPO | Admitting: Internal Medicine

## 2021-02-28 DIAGNOSIS — R739 Hyperglycemia, unspecified: Secondary | ICD-10-CM

## 2021-02-28 DIAGNOSIS — R1032 Left lower quadrant pain: Secondary | ICD-10-CM | POA: Diagnosis not present

## 2021-02-28 DIAGNOSIS — C250 Malignant neoplasm of head of pancreas: Secondary | ICD-10-CM

## 2021-02-28 LAB — CBC WITH DIFFERENTIAL/PLATELET
Basophils Absolute: 0 10*3/uL (ref 0.0–0.1)
Basophils Relative: 0.6 % (ref 0.0–3.0)
Eosinophils Absolute: 0.1 10*3/uL (ref 0.0–0.7)
Eosinophils Relative: 1.1 % (ref 0.0–5.0)
HCT: 38.8 % — ABNORMAL LOW (ref 39.0–52.0)
Hemoglobin: 13.4 g/dL (ref 13.0–17.0)
Lymphocytes Relative: 31.3 % (ref 12.0–46.0)
Lymphs Abs: 2 10*3/uL (ref 0.7–4.0)
MCHC: 34.5 g/dL (ref 30.0–36.0)
MCV: 86.8 fl (ref 78.0–100.0)
Monocytes Absolute: 0.7 10*3/uL (ref 0.1–1.0)
Monocytes Relative: 10.3 % (ref 3.0–12.0)
Neutro Abs: 3.6 10*3/uL (ref 1.4–7.7)
Neutrophils Relative %: 56.7 % (ref 43.0–77.0)
Platelets: 280 10*3/uL (ref 150.0–400.0)
RBC: 4.47 Mil/uL (ref 4.22–5.81)
RDW: 12.4 % (ref 11.5–15.5)
WBC: 6.4 10*3/uL (ref 4.0–10.5)

## 2021-02-28 LAB — HEPATIC FUNCTION PANEL
ALT: 24 U/L (ref 0–53)
AST: 23 U/L (ref 0–37)
Albumin: 4.3 g/dL (ref 3.5–5.2)
Alkaline Phosphatase: 96 U/L (ref 39–117)
Bilirubin, Direct: 0.1 mg/dL (ref 0.0–0.3)
Total Bilirubin: 0.5 mg/dL (ref 0.2–1.2)
Total Protein: 7.5 g/dL (ref 6.0–8.3)

## 2021-02-28 LAB — URINALYSIS, ROUTINE W REFLEX MICROSCOPIC
Bilirubin Urine: NEGATIVE
Ketones, ur: NEGATIVE
Leukocytes,Ua: NEGATIVE
Nitrite: NEGATIVE
RBC / HPF: NONE SEEN (ref 0–?)
Specific Gravity, Urine: 1.02 (ref 1.000–1.030)
Total Protein, Urine: NEGATIVE
Urine Glucose: NEGATIVE
Urobilinogen, UA: 0.2 (ref 0.0–1.0)
pH: 6 (ref 5.0–8.0)

## 2021-02-28 LAB — BASIC METABOLIC PANEL
BUN: 28 mg/dL — ABNORMAL HIGH (ref 6–23)
CO2: 30 mEq/L (ref 19–32)
Calcium: 9.4 mg/dL (ref 8.4–10.5)
Chloride: 100 mEq/L (ref 96–112)
Creatinine, Ser: 1.6 mg/dL — ABNORMAL HIGH (ref 0.40–1.50)
GFR: 45.32 mL/min — ABNORMAL LOW (ref >60.00)
Glucose, Bld: 134 mg/dL — ABNORMAL HIGH (ref 70–99)
Potassium: 4.2 mEq/L (ref 3.5–5.1)
Sodium: 136 mEq/L (ref 135–145)

## 2021-02-28 LAB — LIPASE: Lipase: 19 U/L (ref 11.0–59.0)

## 2021-02-28 MED ORDER — METRONIDAZOLE 250 MG PO TABS: 250.0000 mg | ORAL_TABLET | Freq: Three times a day (TID) | ORAL | 0 refills | Status: AC

## 2021-02-28 MED ORDER — CIPROFLOXACIN HCL 500 MG PO TABS: 500.0000 mg | ORAL_TABLET | Freq: Two times a day (BID) | ORAL | 0 refills | Status: AC

## 2021-02-28 NOTE — Patient Instructions (Signed)
Please take all new medication as prescribed - the antibiotic  Please continue all other medications as before, and refills have been done if requested.  Please have the pharmacy call with any other refills you may need.  Please keep your appointments with your specialists as you may have planned  Please go to the LAB at the blood drawing area for the tests to be done  You will be contacted by phone if any changes need to be made immediately.  Otherwise, you will receive a letter about your results with an explanation, but please check with MyChart first.  Please remember to sign up for MyChart if you have not done so, as this will be important to you in the future with finding out test results, communicating by private email, and scheduling acute appointments online when needed.     

## 2021-02-28 NOTE — Progress Notes (Signed)
Patient ID: Micheal Guerrero, male   DOB: [birth year], 65 y.o.   MRN: [id number]        Chief Complaint: LLQ pain x 3 wks       HPI:  Micheal Guerrero is a 65 y.o. male here with c/o recent onset mild to mod persistent LLQ pain , dull without radiation, without n/v, diarrhea or constipatoin. Has felt somewhat warm at time, no chills.  has hx of diverticulosis on colonoscopy and CT scan in past. Has incidental colonoscopy due may 2022 f/u with Dr Bryan Lemma.  Denies worsening reflux, dysphagia, or blood.   Pt denies  night sweats, loss of appetite, or other constitutional symptoms, in fact has gained wt recently.  Pt denies chest pain, increased sob or doe, wheezing, orthopnea, PND, increased LE swelling, palpitations, dizziness or syncope.  Denies new focal neuro s/s.   Pt denies polydipsia, polyuria,  Wt Readings from Last 3 Encounters:  02/28/21 167 lb (75.8 kg)  09/03/20 157 lb 12.8 oz (71.6 kg)  07/20/20 156 lb 6.4 oz (70.9 kg)   BP Readings from Last 3 Encounters:  02/28/21 132/70  09/03/20 108/64  07/20/20 118/72         Past Medical History:  Diagnosis Date  . Cancer of head of pancreas ([place]) 08/12/2019  . DIVERTICULOSIS, COLON 08/19/2009  . HYPERLIPIDEMIA 08/19/2009  . LEG CRAMPS 08/19/2009   Past Surgical History:  Procedure Laterality Date  . s/p bakers cyst knee      @ age 65  . VASECTOMY      reports that he has quit smoking. He has never used smokeless tobacco. He reports current alcohol use. He reports current drug use. family history includes Dementia in his father. No Known Allergies Current Outpatient Medications on File Prior to Visit  Medication Sig Dispense Refill  . BINAXNOW COVID-19 AG HOME TEST KIT     . calcium carbonate (OS-CAL - DOSED IN MG OF ELEMENTAL CALCIUM) 1250 (500 Ca) MG tablet Take by mouth.    . famotidine (PEPCID) 20 MG tablet Take by mouth.    . pantoprazole (PROTONIX) 40 MG tablet Take by mouth.    . predniSONE (DELTASONE) 20 MG tablet  Take 2 tablets (40 mg total) by mouth daily with breakfast. (Patient not taking: Reported on 02/28/2021) 10 tablet 0   No current facility-administered medications on file prior to visit.        ROS:  All others reviewed and negative.  Objective        PE:  BP 132/70 (BP Location: Left Arm, Patient Position: Sitting, Cuff Size: Large)   Pulse 64   Temp 98.3 F (36.8 C) (Oral)   Ht 5' 9"  (1.753 m)   Wt 167 lb (75.8 kg)   SpO2 99%   BMI 24.66 kg/m                 Constitutional: Pt appears in NAD               HENT: Head: NCAT.                Right Ear: External ear normal.                 Left Ear: External ear normal.                Eyes: . Pupils are equal, round, and reactive to light. Conjunctivae and EOM are normal  Nose: without d/c or deformity               Neck: Neck supple. Gross normal ROM               Cardiovascular: Normal rate and regular rhythm.                 Pulmonary/Chest: Effort normal and breath sounds without rales or wheezing.                Abd:  Soft, ND, + BS, no organomegaly, mild LLQ tender, no guarding or rebound               Neurological: Pt is alert. At baseline orientation, motor grossly intact               Skin: Skin is warm. No rashes, no other new lesions, LE edema - none               Psychiatric: Pt behavior is normal without agitation   Micro: none  Cardiac tracings I have personally interpreted today:  none  Pertinent Radiological findings (summarize): none   Lab Results  Component Value Date   WBC 6.4 02/28/2021   HGB 13.4 02/28/2021   HCT 38.8 (L) 02/28/2021   PLT 280.0 02/28/2021   GLUCOSE 134 (H) 02/28/2021   CHOL 228 (H) 07/20/2020   TRIG 142 07/20/2020   HDL 50 07/20/2020   LDLDIRECT 112.5 08/16/2009   LDLCALC 151 (H) 07/20/2020   ALT 24 02/28/2021   AST 23 02/28/2021   NA 136 02/28/2021   K 4.2 02/28/2021   CL 100 02/28/2021   CREATININE 1.60 (H) 02/28/2021   BUN 28 (H) 02/28/2021   CO2 30 02/28/2021    TSH 3.15 07/20/2020   PSA 0.9 07/20/2020   HGBA1C 5.5 07/20/2020   Assessment/Plan:  Micheal Guerrero is a 65 y.o. White or Caucasian [1] male with  has a past medical history of Cancer of head of pancreas ([place]) (08/12/2019), DIVERTICULOSIS, COLON (08/19/2009), HYPERLIPIDEMIA (08/19/2009), and LEG CRAMPS (08/19/2009).  LLQ pain Etiology unclear, declines CT, for labs as ordered, for empiric cipro/flagyl, and f/u colonoscopy as planned  Hyperglycemia Lab Results  Component Value Date   HGBA1C 5.5 07/20/2020   Stable, pt to continue current medical treatment  - diet   Cancer of head of pancreas ([place]) Cont's in remission per pt, to f/u [facility]  Followup: Return if symptoms worsen or fail to improve.  Cathlean Cower, MD 03/09/2021 9:36 PM [facility] '

## 2021-03-06 ENCOUNTER — Encounter: Payer: Self-pay | Admitting: Internal Medicine

## 2021-03-06 DIAGNOSIS — N289 Disorder of kidney and ureter, unspecified: Secondary | ICD-10-CM

## 2021-03-06 DIAGNOSIS — N179 Acute kidney failure, unspecified: Secondary | ICD-10-CM

## 2021-03-09 ENCOUNTER — Encounter: Payer: Self-pay | Admitting: Internal Medicine

## 2021-03-09 NOTE — Assessment & Plan Note (Signed)
Etiology unclear, declines CT, for labs as ordered, for empiric cipro/flagyl, and f/u colonoscopy as planned

## 2021-03-09 NOTE — Assessment & Plan Note (Signed)
Cont's in remission per pt, to f/u [facility]

## 2021-03-09 NOTE — Assessment & Plan Note (Signed)
Lab Results  Component Value Date   HGBA1C 5.5 07/20/2020   Stable, pt to continue current medical treatment  - diet

## 2021-03-11 ENCOUNTER — Ambulatory Visit
Admission: RE | Admit: 2021-03-11 | Discharge: 2021-03-11 | Disposition: A | Discharge status: Home / Self Care | Payer: BC Managed Care – PPO | Source: Ambulatory Visit | Attending: Internal Medicine | Admitting: Internal Medicine

## 2021-03-11 ENCOUNTER — Other Ambulatory Visit: Payer: Self-pay | Admitting: Internal Medicine

## 2021-03-11 ENCOUNTER — Encounter: Payer: Self-pay | Admitting: Internal Medicine

## 2021-03-11 DIAGNOSIS — N1330 Unspecified hydronephrosis: Secondary | ICD-10-CM

## 2021-03-11 DIAGNOSIS — N179 Acute kidney failure, unspecified: Secondary | ICD-10-CM

## 2021-03-24 ENCOUNTER — Encounter (HOSPITAL_BASED_OUTPATIENT_CLINIC_OR_DEPARTMENT_OTHER): Payer: Self-pay

## 2021-03-24 ENCOUNTER — Ambulatory Visit (HOSPITAL_BASED_OUTPATIENT_CLINIC_OR_DEPARTMENT_OTHER): Admit: 2021-03-24 | Payer: BC Managed Care – PPO | Admitting: Orthopedic Surgery

## 2021-03-24 SURGERY — CARPAL TUNNEL RELEASE
Anesthesia: Choice | Laterality: Left

## 2021-03-30 ENCOUNTER — Other Ambulatory Visit: Payer: Self-pay

## 2021-03-30 ENCOUNTER — Encounter: Payer: Self-pay | Admitting: Internal Medicine

## 2021-03-30 ENCOUNTER — Ambulatory Visit: Payer: BC Managed Care – PPO | Admitting: Gastroenterology

## 2021-03-30 ENCOUNTER — Other Ambulatory Visit: Payer: BC Managed Care – PPO

## 2021-03-30 ENCOUNTER — Ambulatory Visit
Admission: RE | Admit: 2021-03-30 | Discharge: 2021-03-30 | Disposition: A | Discharge status: Home / Self Care | Payer: BC Managed Care – PPO | Source: Ambulatory Visit | Attending: Internal Medicine | Admitting: Internal Medicine

## 2021-03-30 ENCOUNTER — Encounter: Payer: Self-pay | Admitting: Gastroenterology

## 2021-03-30 ENCOUNTER — Ambulatory Visit (INDEPENDENT_AMBULATORY_CARE_PROVIDER_SITE_OTHER): Payer: BC Managed Care – PPO | Admitting: Gastroenterology

## 2021-03-30 ENCOUNTER — Telehealth: Payer: Self-pay

## 2021-03-30 VITALS — BP 140/82 | HR 65 | Ht 69.0 in | Wt 167.4 lb

## 2021-03-30 DIAGNOSIS — R7989 Other specified abnormal findings of blood chemistry: Secondary | ICD-10-CM

## 2021-03-30 DIAGNOSIS — K5730 Diverticulosis of large intestine without perforation or abscess without bleeding: Secondary | ICD-10-CM

## 2021-03-30 DIAGNOSIS — N1330 Unspecified hydronephrosis: Secondary | ICD-10-CM

## 2021-03-30 DIAGNOSIS — Z1211 Encounter for screening for malignant neoplasm of colon: Secondary | ICD-10-CM

## 2021-03-30 DIAGNOSIS — C250 Malignant neoplasm of head of pancreas: Secondary | ICD-10-CM | POA: Diagnosis not present

## 2021-03-30 MED ORDER — CLENPIQ 10-3.5-12 MG-GM -GM/160ML PO SOLN: 1.0000 | ORAL | 0 refills | Status: DC

## 2021-03-30 NOTE — Patient Instructions (Signed)
If you are age 65 or older, your body mass index should be between 23-30. Your Body mass index is 24.72 kg/m. If this is out of the aforementioned range listed, please consider follow up with your Primary Care Provider.  If you are age 36 or younger, your body mass index should be between 19-25. Your Body mass index is 24.72 kg/m. If this is out of the aformentioned range listed, please consider follow up with your Primary Care Provider.   We have sent the following medications to your pharmacy for you to pick up at your convenience:  Clenpiq for colonoscopy prep  Due to recent changes in healthcare laws, you may see the results of your imaging and laboratory studies on MyChart before your provider has had a chance to review them.  We understand that in some cases there may be results that are confusing or concerning to you. Not all laboratory results come back in the same time frame and the provider may be waiting for multiple results in order to interpret others.  Please give [place] 48 hours in order for your provider to thoroughly review all the results before contacting the office for clarification of your results.   Thank you for choosing me and [facility].  Vito Cirigliano, D.O.

## 2021-03-30 NOTE — Progress Notes (Signed)
P  Chief Complaint:   Colon Cancer Screening   GI History: 65 year old male with a history of stage Ib Pancreatic Cancer diagnosed in 07/2019 for work-up of MEG pain and pancreatitis s/p Whipple at [facility] in 08/2019. - CT (07/09/2019): Focal fatty infiltration otherwise normal liver with normal CBD.  Significant PD dilation with abrupt cut off at HOP, and peripancreatic inflammatory changes.  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  Sigmoid diverticulosis. - EUS (07/15/2021) pancreatic Mass (pathology negative) - EUS (07/30/2019): Pancreatic head mass (pathology: Atypical glandular epithelium concerning for adenocarcinoma).  No vascular invasion -CT CAP (08/02/2019): inflammatory changes within the duodenopancreatic groove, peripancreatic tail stranding, pancreatic ductal dilation, and cystic dystrophy of the duodenal wall. 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 No definite pancreatic mass is visualized. - Whipple (08/12/2019 at [facility]) pathology showed invasive pancreatic ductal adenocarcinoma, moderately differentiated with negative margins and 0/21 lymph nodes involved.  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 Henrene Pastor): Sigmoid diverticulosis, otherwise normal.  Recommended repeat in 10 years - EUS (07/15/2021) pancreatic Mass (pathology negative) - EUS (07/30/2019): Pancreatic head mass (pathology: Atypical glandular epithelium concerning for adenocarcinoma).  No vascular invasion  HPI:     Patient is a 65 y.o. male presenting to the Gastroenterology Clinic to discuss repeat colonoscopy for ongoing CRC screening.  Diagnosed with pancreatic adenocarcinoma in 07/2019, s/p Whipple in 08/2019 and adjuvant chemotherapy.  Continues to follow with [facility], with serial surveillance imaging negative for recurrence or metastasis.  Last imaging was CT CAP  01/11/2021.  Labs from 01/11/2021 at [facility] reviewed:  Normal CMP, mild stable anemia at 12.9/37.8.  Negative CA 19-9.  Repeat BMP in 02/28/21 with creat 1.6.  Renal ultrasound with new moderate left hydronephrosis suggesting left ureteral obstruction.  Scheduled for kidney CT today and Urology referral in place.  He is also scheduled for carpal tunnel release next month, and has f/u at [facility] in May.   Review of systems:     No chest pain, no SOB, no fevers, no urinary sx   Past Medical History:  Diagnosis Date  . Cancer of head of pancreas ([place]) 08/12/2019  . DIVERTICULOSIS, COLON 08/19/2009  . HYPERLIPIDEMIA 08/19/2009  . LEG CRAMPS 08/19/2009    Patient's surgical history, family medical history, social history, medications and allergies were all reviewed in Epic    Current Outpatient Medications  Medication Sig Dispense Refill  . famotidine (PEPCID) 20 MG tablet Take by mouth.     No current facility-administered medications for this visit.    Physical Exam:     BP 140/82 (BP Location: Left Arm, Patient Position: Sitting, Cuff Size: Normal)   Pulse 65   Ht 5\' 9"  (1.753 m)   Wt 167 lb 6 oz (75.9 kg)   BMI 24.72 kg/m   GENERAL:  Pleasant male in NAD PSYCH: : Cooperative, normal affect Musculoskeletal:  Normal muscle tone, normal strength NEURO: Alert and oriented x 3, no focal neurologic deficits   IMPRESSION and PLAN:    1) Colon cancer screening - Schedule colonoscopy for ongoing CRC screening  2) Pancreatic cancer - Diagnosed with pancreatic adenocarcinoma in 07/2019, s/p Whipple in 08/2019 and adjuvant chemotherapy.  Continues to follow with [facility], with serial surveillance imaging negative for recurrence or metastasis.  3) Elevated creatinine 4) Left hydronephrosis - Schedule for CT stone protocol later today and has Urology referral in place  5) History  of diverticulosis - Evaluate location/severity at time of colonoscopy as above  The indications,  risks, and benefits of colonoscopy were explained to the patient in detail. Risks include but are not limited to bleeding, perforation, adverse reaction to medications, and cardiopulmonary compromise. Sequelae include but are not limited to the possibility of surgery, hospitalization, and mortality. The patient verbalized understanding and wished to proceed. All questions answered, referred to the scheduler and bowel prep ordered. Further recommendations pending results of the exam.          Lavena Bullion ,DO, FACG 03/30/2021, 9:21 AM

## 2021-04-04 ENCOUNTER — Other Ambulatory Visit (HOSPITAL_COMMUNITY): Payer: BC Managed Care – PPO

## 2021-04-07 ENCOUNTER — Encounter (HOSPITAL_BASED_OUTPATIENT_CLINIC_OR_DEPARTMENT_OTHER): Admission: RE | Payer: Self-pay | Source: Home / Self Care

## 2021-04-07 ENCOUNTER — Ambulatory Visit (HOSPITAL_BASED_OUTPATIENT_CLINIC_OR_DEPARTMENT_OTHER)
Admission: RE | Admit: 2021-04-07 | Payer: BC Managed Care – PPO | Source: Home / Self Care | Admitting: Orthopedic Surgery

## 2021-04-07 SURGERY — CARPAL TUNNEL RELEASE
Anesthesia: Choice | Laterality: Left

## 2021-04-29 NOTE — Telephone Encounter (Signed)
Dr. Jenny Reichmann addressed

## 2021-05-03 ENCOUNTER — Encounter: Payer: Self-pay | Admitting: Gastroenterology

## 2021-05-16 ENCOUNTER — Encounter: Payer: BC Managed Care – PPO | Admitting: Gastroenterology

## 2021-06-03 ENCOUNTER — Encounter: Payer: Self-pay | Admitting: Internal Medicine

## 2021-06-03 MED ORDER — NIRMATRELVIR/RITONAVIR (PAXLOVID)TABLET
3.0000 | ORAL_TABLET | Freq: Two times a day (BID) | ORAL | 0 refills | Status: DC
Start: 2021-06-03 — End: 2021-06-03

## 2021-06-03 MED ORDER — NIRMATRELVIR/RITONAVIR (PAXLOVID)TABLET: 3.0000 | ORAL_TABLET | Freq: Two times a day (BID) | ORAL | 0 refills | Status: AC

## 2021-06-03 NOTE — Telephone Encounter (Signed)
Paxlovid is NOT available at [facility] and they are unable to get it. They do not carry that medication.   Please send the prescription to:   [facility]   [address], [place], [place] [address]

## 2021-06-03 NOTE — Addendum Note (Signed)
Addended by: Biagio Borg on: 06/03/2021 02:01 PM   Modules accepted: Orders

## 2021-06-17 DIAGNOSIS — N1330 Unspecified hydronephrosis: Secondary | ICD-10-CM | POA: Insufficient documentation

## 2021-08-19 ENCOUNTER — Other Ambulatory Visit: Payer: Self-pay

## 2021-08-19 ENCOUNTER — Emergency Department (HOSPITAL_COMMUNITY)
Admission: EM | Admit: 2021-08-19 | Discharge: 2021-08-19 | Disposition: A | Discharge status: Home / Self Care | Payer: BC Managed Care – PPO | Attending: Emergency Medicine | Admitting: Emergency Medicine

## 2021-08-19 ENCOUNTER — Encounter (HOSPITAL_COMMUNITY): Payer: Self-pay | Admitting: Emergency Medicine

## 2021-08-19 DIAGNOSIS — R0602 Shortness of breath: Secondary | ICD-10-CM | POA: Diagnosis not present

## 2021-08-19 DIAGNOSIS — R1013 Epigastric pain: Secondary | ICD-10-CM | POA: Diagnosis present

## 2021-08-19 DIAGNOSIS — Z5321 Procedure and treatment not carried out due to patient leaving prior to being seen by health care provider: Secondary | ICD-10-CM | POA: Insufficient documentation

## 2021-08-19 DIAGNOSIS — Z8507 Personal history of malignant neoplasm of pancreas: Secondary | ICD-10-CM | POA: Diagnosis not present

## 2021-08-19 LAB — CBC WITH DIFFERENTIAL/PLATELET
Abs Immature Granulocytes: 0.03 10*3/uL (ref 0.00–0.07)
Basophils Absolute: 0 10*3/uL (ref 0.0–0.1)
Basophils Relative: 0 %
Eosinophils Absolute: 0.1 10*3/uL (ref 0.0–0.5)
Eosinophils Relative: 1 %
HCT: 28.9 % — ABNORMAL LOW (ref 39.0–52.0)
Hemoglobin: 9.4 g/dL — ABNORMAL LOW (ref 13.0–17.0)
Immature Granulocytes: 1 %
Lymphocytes Relative: 30 %
Lymphs Abs: 1.2 10*3/uL (ref 0.7–4.0)
MCH: 30 pg (ref 26.0–34.0)
MCHC: 32.5 g/dL (ref 30.0–36.0)
MCV: 92.3 fL (ref 80.0–100.0)
Monocytes Absolute: 0.3 10*3/uL (ref 0.1–1.0)
Monocytes Relative: 8 %
Neutro Abs: 2.2 10*3/uL (ref 1.7–7.7)
Neutrophils Relative %: 60 %
Platelets: 230 10*3/uL (ref 150–400)
RBC: 3.13 MIL/uL — ABNORMAL LOW (ref 4.22–5.81)
RDW: 14 % (ref 11.5–15.5)
WBC: 3.8 10*3/uL — ABNORMAL LOW (ref 4.0–10.5)
nRBC: 0 % (ref 0.0–0.2)

## 2021-08-19 LAB — COMPREHENSIVE METABOLIC PANEL
ALT: 127 U/L — ABNORMAL HIGH (ref 0–44)
AST: 133 U/L — ABNORMAL HIGH (ref 15–41)
Albumin: 3.5 g/dL (ref 3.5–5.0)
Alkaline Phosphatase: 74 U/L (ref 38–126)
Anion gap: 6 (ref 5–15)
BUN: 19 mg/dL (ref 8–23)
CO2: 23 mmol/L (ref 22–32)
Calcium: 8.5 mg/dL — ABNORMAL LOW (ref 8.9–10.3)
Chloride: 109 mmol/L (ref 98–111)
Creatinine, Ser: 1.44 mg/dL — ABNORMAL HIGH (ref 0.61–1.24)
GFR, Estimated: 54 mL/min — ABNORMAL LOW (ref >60)
Glucose, Bld: 206 mg/dL — ABNORMAL HIGH (ref 70–99)
Potassium: 3.4 mmol/L — ABNORMAL LOW (ref 3.5–5.1)
Sodium: 138 mmol/L (ref 135–145)
Total Bilirubin: 1 mg/dL (ref 0.3–1.2)
Total Protein: 6 g/dL — ABNORMAL LOW (ref 6.5–8.1)

## 2021-08-19 LAB — TROPONIN I (HIGH SENSITIVITY): Troponin I (High Sensitivity): 7 ng/L (ref <18)

## 2021-08-19 LAB — LIPASE, BLOOD: Lipase: 36 U/L (ref 11–51)

## 2021-08-19 NOTE — ED Notes (Signed)
Pt's name had been placed in room however he informed RN and Provider that he was feeling well enough now to "take the 55 minute drive to [facility]."  Provider aware and RN removed IV.

## 2021-08-19 NOTE — ED Triage Notes (Signed)
Pt reports acute sudden onset of upper abd pain.  Denies n/v.  Pt reports he has been spending his days hiking.  Pt has pancreatic cancer.

## 2021-08-19 NOTE — ED Provider Notes (Addendum)
Emergency Medicine Provider Triage Evaluation Note  Micheal Guerrero , a 65 y.o. male  was evaluated in triage.  Pt complains of acute onset of epigastric pain.  Does not radiate anywhere.  Patient has history of pancreatic cancer, was recently on a hiking trip in [place] and was driving home when he had sudden onset of upper abdominal pain.  Associated shortness of breath, pain is not worsened with inspiration or expiration.  Review of Systems  Positive: Epigastric pain, shortness of breath Negative: CP  Physical Exam  BP 129/81   Pulse (!) 44   Temp 97.6 F (36.4 C) (Oral)   Resp 18   SpO2 100%  Gen:   Awake, no distress   Resp:  Normal effort  MSK:   Moves extremities without difficulty  Other:  Epigastric tenderness, well localized.  Medical Decision Making  Medically screening exam initiated at 7:28 PM.  Appropriate orders placed.  Micheal Guerrero was informed that the remainder of the evaluation will be completed by another provider, this initial triage assessment does not replace that evaluation, and the importance of remaining in the ED until their evaluation is complete.  Spoke with Dr. Doren Custard about this patient.  We will proceed with CTA as well as CT abdomen with contrast.  We will also check basic labs.  Patient reports resolution of his pain.  States it is now 1 out of 10 and patient would prefer at this time to continue his drive back to determine to go to [facility] where he gets his chemo treatment.  Discussed the obvious risks of this with the patient and he is agreeable to taking them.  He feels much improved and would prefer to stay in the same hospital system for which he gets treatment.  Patient is amatory without issues, his vitals are stable although he is mildly bradycardic.  Patient reports his baseline heart rate is typically in the 50s.  At this time patient is leaving, would prefer to do the work-up here.    Sherrill Raring, PA-C 08/19/21  1929    Sherrill Raring, PA-C 08/19/21 1957    Luna Fuse, MD 08/19/21 2000

## 2021-08-26 DIAGNOSIS — A419 Sepsis, unspecified organism: Secondary | ICD-10-CM | POA: Insufficient documentation

## 2021-08-26 DIAGNOSIS — N12 Tubulo-interstitial nephritis, not specified as acute or chronic: Secondary | ICD-10-CM | POA: Insufficient documentation

## 2021-08-31 DIAGNOSIS — N135 Crossing vessel and stricture of ureter without hydronephrosis: Secondary | ICD-10-CM | POA: Insufficient documentation

## 2021-10-26 ENCOUNTER — Telehealth: Payer: Self-pay | Admitting: Internal Medicine

## 2021-10-26 NOTE — Telephone Encounter (Signed)
[facility] called on behalf patient. Patient is requesting his labs to be done at our lab instead of theirs. She said he was very unpleasant and was unwilling to go there. Pt needs the labs done to be done for his upcoming procedure.   Orders are scanned in the chart and in the e-fax folder.   Please advise.

## 2021-10-26 NOTE — Telephone Encounter (Signed)
Very sorry, we dont normally do labs on behalf of other health entities for legal reasons;  this has been a long standing policy for over 25 yrs that I have been employed at [employer]

## 2021-11-01 ENCOUNTER — Encounter: Payer: BC Managed Care – PPO | Admitting: Internal Medicine

## 2021-11-16 ENCOUNTER — Encounter: Payer: Self-pay | Admitting: Internal Medicine

## 2021-11-30 ENCOUNTER — Ambulatory Visit (INDEPENDENT_AMBULATORY_CARE_PROVIDER_SITE_OTHER): Payer: Medicare Other | Admitting: Internal Medicine

## 2021-11-30 ENCOUNTER — Encounter: Payer: Self-pay | Admitting: Internal Medicine

## 2021-11-30 ENCOUNTER — Other Ambulatory Visit: Payer: Self-pay

## 2021-11-30 VITALS — BP 116/70 | HR 61 | Temp 98.1°F | Ht 69.0 in | Wt 158.0 lb

## 2021-11-30 DIAGNOSIS — E7800 Pure hypercholesterolemia, unspecified: Secondary | ICD-10-CM

## 2021-11-30 DIAGNOSIS — C250 Malignant neoplasm of head of pancreas: Secondary | ICD-10-CM

## 2021-11-30 DIAGNOSIS — E559 Vitamin D deficiency, unspecified: Secondary | ICD-10-CM

## 2021-11-30 DIAGNOSIS — R739 Hyperglycemia, unspecified: Secondary | ICD-10-CM

## 2021-11-30 DIAGNOSIS — Z23 Encounter for immunization: Secondary | ICD-10-CM | POA: Diagnosis not present

## 2021-11-30 DIAGNOSIS — N320 Bladder-neck obstruction: Secondary | ICD-10-CM | POA: Diagnosis not present

## 2021-11-30 DIAGNOSIS — E538 Deficiency of other specified B group vitamins: Secondary | ICD-10-CM

## 2021-11-30 DIAGNOSIS — I10 Essential (primary) hypertension: Secondary | ICD-10-CM | POA: Diagnosis not present

## 2021-11-30 LAB — LIPID PANEL
Cholesterol: 156 mg/dL (ref 0–200)
HDL: 56.2 mg/dL (ref >39.00)
LDL Cholesterol: 78 mg/dL (ref 0–99)
NonHDL: 99.85
Total CHOL/HDL Ratio: 3
Triglycerides: 108 mg/dL (ref 0.0–149.0)
VLDL: 21.6 mg/dL (ref 0.0–40.0)

## 2021-11-30 LAB — HEMOGLOBIN A1C: Hgb A1c MFr Bld: 5.8 % (ref 4.6–6.5)

## 2021-11-30 LAB — VITAMIN B12: Vitamin B-12: 242 pg/mL (ref 211–911)

## 2021-11-30 LAB — PSA: PSA: 1.16 ng/mL (ref 0.10–4.00)

## 2021-11-30 LAB — TSH: TSH: 2.35 u[IU]/mL (ref 0.35–5.50)

## 2021-11-30 LAB — VITAMIN D 25 HYDROXY (VIT D DEFICIENCY, FRACTURES): VITD: 19.41 ng/mL — ABNORMAL LOW (ref 30.00–100.00)

## 2021-11-30 NOTE — Progress Notes (Signed)
Patient ID: Micheal Guerrero, male   DOB: [birth year], 65 y.o.   MRN: [id number]         Chief Complaint:: yearly exam       HPI:  Micheal Guerrero is a 65 y.o. male here for above; overall doing ok, Pt denies chest pain, increased sob or doe, wheezing, orthopnea, PND, increased LE swelling, palpitations, dizziness or syncope.   Pt denies polydipsia, polyuria, or new focal neuro s/s.   Pt denies fever, wt loss, night sweats, loss of appetite, or other constitutional symptoms  Lost about 9 lbs but oncologically stable per pt.  Due for prevnar 20.  No other new complaints.   Wt Readings from Last 3 Encounters:  11/30/21 158 lb (71.7 kg)  03/30/21 167 lb 6 oz (75.9 kg)  02/28/21 167 lb (75.8 kg)   BP Readings from Last 3 Encounters:  11/30/21 116/70  08/19/21 129/81  03/30/21 140/82   Immunization History  Administered Date(s) Administered   Hep A / Hep B 12/21/2011, 02/19/2012, 07/09/2012   Hepatitis A 04/28/2003   Hepatitis A, Ped/Adol-2 Dose 04/28/2003   Influenza,inj,Quad PF,6+ Mos 10/29/2018, 07/24/2019, 09/03/2020   Influenza-Unspecified 10/29/2018, 11/09/2021   PFIZER(Purple Top)SARS-COV-2 Vaccination 02/12/2020, 03/09/2020, 07/23/2020, 04/18/2021, 10/07/2021   PNEUMOCOCCAL CONJUGATE-20 11/30/2021   Tdap 12/21/2011   Typhoid Live 05/04/2016   Zoster Recombinat (Shingrix) 08/16/2017, 10/04/2017, 05/29/2018, 05/30/2018   Health Maintenance Due  Topic Date Due   COLONOSCOPY (Pts 45-36yr Insurance coverage will need to be confirmed)  08/05/2019      Past Medical History:  Diagnosis Date   Cancer of head of pancreas ([place] 08/12/2019   DIVERTICULOSIS, COLON 08/19/2009   HYPERLIPIDEMIA 08/19/2009   LEG CRAMPS 08/19/2009   Past Surgical History:  Procedure Laterality Date   s/p bakers cyst knee      @ age 65  VASECTOMY      reports that he has quit smoking. He has never used smokeless tobacco. He reports current alcohol use. He reports current drug use. family history  includes Dementia in his father. No Known Allergies Current Outpatient Medications on File Prior to Visit  Medication Sig Dispense Refill   acetaminophen (TYLENOL) 500 MG tablet Take by mouth.     famotidine (PEPCID) 20 MG tablet Take by mouth.     dexamethasone (DECADRON) 4 MG tablet Take by mouth. (Patient not taking: Reported on 11/30/2021)     ondansetron (ZOFRAN-ODT) 8 MG disintegrating tablet Take by mouth. (Patient not taking: Reported on 11/30/2021)     prochlorperazine (COMPAZINE) 10 MG tablet Take by mouth. (Patient not taking: Reported on 11/30/2021)     senna (SENOKOT) 8.6 MG tablet Take by mouth. (Patient not taking: Reported on 11/30/2021)     Sod Picosulfate-Mag Ox-Cit Acd (CLENPIQ) 10-3.5-12 MG-GM -GM/160ML SOLN Take 1 kit by mouth as directed. (Patient not taking: Reported on 11/30/2021) 320 mL 0   tamsulosin (FLOMAX) 0.4 MG CAPS capsule Take 0.4 mg by mouth daily. (Patient not taking: Reported on 11/30/2021)     No current facility-administered medications on file prior to visit.        ROS:  All others reviewed and negative.  Objective        PE:  BP 116/70 (BP Location: Right Arm, Patient Position: Sitting, Cuff Size: Large)    Pulse 61    Temp 98.1 F (36.7 C) (Oral)    Ht 5' 9"  (1.753 m)    Wt 158 lb (71.7 kg)    SpO2 97%  BMI 23.33 kg/m                 Constitutional: Pt appears in NAD               HENT: Head: NCAT.                Right Ear: External ear normal.                 Left Ear: External ear normal.                Eyes: . Pupils are equal, round, and reactive to light. Conjunctivae and EOM are normal               Nose: without d/c or deformity               Neck: Neck supple. Gross normal ROM               Cardiovascular: Normal rate and regular rhythm.                 Pulmonary/Chest: Effort normal and breath sounds without rales or wheezing.                Abd:  Soft, NT, ND, + BS, no organomegaly               Neurological: Pt is alert. At  baseline orientation, motor grossly intact               Skin: Skin is warm. No rashes, no other new lesions, LE edema - none               Psychiatric: Pt behavior is normal without agitation   Micro: none  Cardiac tracings I have personally interpreted today:  none  Pertinent Radiological findings (summarize): none   Lab Results  Component Value Date   WBC 3.8 (L) 08/19/2021   HGB 9.4 (L) 08/19/2021   HCT 28.9 (L) 08/19/2021   PLT 230 08/19/2021   GLUCOSE 206 (H) 08/19/2021   CHOL 156 11/30/2021   TRIG 108.0 11/30/2021   HDL 56.20 11/30/2021   LDLDIRECT 112.5 08/16/2009   LDLCALC 78 11/30/2021   ALT 127 (H) 08/19/2021   AST 133 (H) 08/19/2021   NA 138 08/19/2021   K 3.4 (L) 08/19/2021   CL 109 08/19/2021   CREATININE 1.44 (H) 08/19/2021   BUN 19 08/19/2021   CO2 23 08/19/2021   TSH 2.35 11/30/2021   PSA 1.16 11/30/2021   HGBA1C 5.8 11/30/2021   Assessment/Plan:  WASIL WOLKE is a 65 y.o. White or Caucasian [1] male with  has a past medical history of Cancer of head of pancreas ([place]) (08/12/2019), DIVERTICULOSIS, COLON (08/19/2009), HYPERLIPIDEMIA (08/19/2009), and LEG CRAMPS (08/19/2009).  Hyperlipidemia Lab Results  Component Value Date   LDLCALC 78 11/30/2021   Stable, pt to continue current statin low chol diet, declines statin   Hyperglycemia Lab Results  Component Value Date   HGBA1C 5.8 11/30/2021   Stable, pt to continue current medical treatment  - diet   Cancer of head of pancreas ([place]) Stable recent per pt, for f/u as planned  Elevated systolic blood pressure reading with diagnosis of hypertension BP Readings from Last 3 Encounters:  11/30/21 116/70  08/19/21 129/81  03/30/21 140/82   Stable, pt to continue medical treatment  - diet  Followup: Return in about 1 year (around 11/30/2022).  Cathlean Cower, MD 12/03/2021 6:00 PM [facility]

## 2021-11-30 NOTE — Patient Instructions (Addendum)
You had the Prevnar 20 pneumonia shot today  Please have the Tdap tetenus shot done at your local pharmacy  Please continue all other medications as before, and refills have been done if requested.  Please have the pharmacy call with any other refills you may need.  Please continue your efforts at being more active, low cholesterol diet, and weight control.  You are otherwise up to date with prevention measures today.  Please keep your appointments with your specialists as you may have planned  Please go to the LAB at the blood drawing area for the tests to be done  You will be contacted by phone if any changes need to be made immediately.  Otherwise, you will receive a letter about your results with an explanation, but please check with MyChart first.  Please remember to sign up for MyChart if you have not done so, as this will be important to you in the future with finding out test results, communicating by private email, and scheduling acute appointments online when needed.  Please make an Appointment to return for your 1 year visit, or sooner if needed

## 2021-12-03 ENCOUNTER — Encounter: Payer: Self-pay | Admitting: Internal Medicine

## 2021-12-03 NOTE — Assessment & Plan Note (Signed)
Lab Results  Component Value Date   LDLCALC 78 11/30/2021   Stable, pt to continue current statin low chol diet, declines statin

## 2021-12-03 NOTE — Assessment & Plan Note (Signed)
Stable recent per pt, for f/u as planned

## 2021-12-03 NOTE — Assessment & Plan Note (Signed)
Lab Results  Component Value Date   HGBA1C 5.8 11/30/2021   Stable, pt to continue current medical treatment  - diet

## 2021-12-03 NOTE — Assessment & Plan Note (Signed)
BP Readings from Last 3 Encounters:  11/30/21 116/70  08/19/21 129/81  03/30/21 140/82   Stable, pt to continue medical treatment  - diet

## 2022-01-02 ENCOUNTER — Telehealth (INDEPENDENT_AMBULATORY_CARE_PROVIDER_SITE_OTHER): Payer: Medicare Other | Admitting: Internal Medicine

## 2022-01-02 ENCOUNTER — Encounter: Payer: Self-pay | Admitting: Internal Medicine

## 2022-01-02 DIAGNOSIS — R739 Hyperglycemia, unspecified: Secondary | ICD-10-CM | POA: Diagnosis not present

## 2022-01-02 DIAGNOSIS — C250 Malignant neoplasm of head of pancreas: Secondary | ICD-10-CM

## 2022-01-02 DIAGNOSIS — Z433 Encounter for attention to colostomy: Secondary | ICD-10-CM | POA: Diagnosis not present

## 2022-01-02 DIAGNOSIS — R197 Diarrhea, unspecified: Secondary | ICD-10-CM

## 2022-01-02 NOTE — Patient Instructions (Signed)
Ok to follow the new Standing Order for BMP while taking the high dose Potassium 40 meq - 1 to 2 times per day depending on level of diarrhea that day, by going to the [facility] in the basement up to even daily if needed  Please continue all other medications as before, and refills have been done if requested.  Please have the pharmacy call with any other refills you may need.  Please keep your appointments with your specialists as you may have planned  Good luck with the Chemotherapy!

## 2022-01-02 NOTE — Assessment & Plan Note (Signed)
For start dual agent chemo tx very soon at [facility]

## 2022-01-02 NOTE — Assessment & Plan Note (Signed)
S/p colostomy - Ok to follow the new Standing Order for BMP while taking the high dose Potassium 40 meq - 1 to 2 times per day depending on level of diarrhea that day, by going to the [facility] in the basement up to even daily if needed

## 2022-01-02 NOTE — Assessment & Plan Note (Signed)
Lab Results  Component Value Date   HGBA1C 5.8 11/30/2021   Stable, pt to continue current medical treatment  diet

## 2022-01-02 NOTE — Progress Notes (Signed)
Patient ID: Micheal Guerrero, male   DOB: 1956/02/26, 66 y.o.   MRN: [id number]  Virtual Visit via Video Note  I connected with Micheal Guerrero on 01/02/22 at  8:40 AM EST by a video enabled telemedicine application and verified that I am speaking with the correct person using two identifiers.  Location of all perticipants today Patient: at home Provider: at office   I discussed the limitations of evaluation and management by telemedicine and the availability of in person appointments. The patient expressed understanding and agreed to proceed.  History of Present Illness: Here to f/u after recent skiiing trip to [place] where he unfortunately developed norovirus infection, but also a descending colon large bowel obstruction due to extrinsic compression from known pancreatic mass/malignancy.  Pt now s/p colostomy and doing very well now followed at [facility], but they mentioned as he was needing to take Kdur 40 meq up to twice per day depending on stool consistently (one per day for more solid stool, twice per day for more liquid stool), he would need to be monitored to make sure of low or high K levels,  Pt was hoping for that to be done locally.  Pt denies chest pain, increased sob or doe, wheezing, orthopnea, PND, increased LE swelling, palpitations, dizziness or syncope.   Pt denies polydipsia, polyuria, or new focal neuro s/s  No fever, chills, blood in stools.  The current plan is for dual chemo agent therapy every 2 wks with cmp/cbc prior to start very soon.      Past Medical History:  Diagnosis Date   Cancer of head of pancreas ([place]) 08/12/2019   DIVERTICULOSIS, COLON 08/19/2009   HYPERLIPIDEMIA 08/19/2009   LEG CRAMPS 08/19/2009   Past Surgical History:  Procedure Laterality Date   s/p bakers cyst knee      @ age 49   VASECTOMY      reports that he has quit smoking. He has never used smokeless tobacco. He reports current alcohol use. He reports current drug  use. family history includes Dementia in his father. No Known Allergies Current Outpatient Medications on File Prior to Visit  Medication Sig Dispense Refill   acetaminophen (TYLENOL) 500 MG tablet Take by mouth.     dexamethasone (DECADRON) 4 MG tablet Take by mouth. (Patient not taking: Reported on 11/30/2021)     famotidine (PEPCID) 20 MG tablet Take by mouth.     ondansetron (ZOFRAN-ODT) 8 MG disintegrating tablet Take by mouth. (Patient not taking: Reported on 11/30/2021)     prochlorperazine (COMPAZINE) 10 MG tablet Take by mouth. (Patient not taking: Reported on 11/30/2021)     senna (SENOKOT) 8.6 MG tablet Take by mouth. (Patient not taking: Reported on 11/30/2021)     Sod Picosulfate-Mag Ox-Cit Acd (CLENPIQ) 10-3.5-12 MG-GM -GM/160ML SOLN Take 1 kit by mouth as directed. (Patient not taking: Reported on 11/30/2021) 320 mL 0   tamsulosin (FLOMAX) 0.4 MG CAPS capsule Take 0.4 mg by mouth daily. (Patient not taking: Reported on 11/30/2021)     No current facility-administered medications on file prior to visit.    Observations/Objective: Alert, NAD, appropriate mood and affect, resps normal, cn 2-12 intact, moves all 4s, no visible rash or swelling Lab Results  Component Value Date   WBC 3.8 (L) 08/19/2021   HGB 9.4 (L) 08/19/2021   HCT 28.9 (L) 08/19/2021   PLT 230 08/19/2021   GLUCOSE 206 (H) 08/19/2021   CHOL 156 11/30/2021   TRIG 108.0 11/30/2021  HDL 56.20 11/30/2021   LDLDIRECT 112.5 08/16/2009   LDLCALC 78 11/30/2021   ALT 127 (H) 08/19/2021   AST 133 (H) 08/19/2021   NA 138 08/19/2021   K 3.4 (L) 08/19/2021   CL 109 08/19/2021   CREATININE 1.44 (H) 08/19/2021   BUN 19 08/19/2021   CO2 23 08/19/2021   TSH 2.35 11/30/2021   PSA 1.16 11/30/2021   HGBA1C 5.8 11/30/2021   Assessment and Plan: See notes  Follow Up Instructions: See notes  I discussed the assessment and treatment plan with the patient. The patient was provided an opportunity to ask questions  and all were answered. The patient agreed with the plan and demonstrated an understanding of the instructions.   The patient was advised to call back or seek an in-person evaluation if the symptoms worsen or if the condition fails to improve as anticipated.  Cathlean Cower, MD '

## 2022-01-02 NOTE — Assessment & Plan Note (Signed)
To also f/u [facility] as well

## 2022-01-03 ENCOUNTER — Other Ambulatory Visit (INDEPENDENT_AMBULATORY_CARE_PROVIDER_SITE_OTHER): Payer: Medicare Other

## 2022-01-03 DIAGNOSIS — R197 Diarrhea, unspecified: Secondary | ICD-10-CM | POA: Diagnosis not present

## 2022-01-03 LAB — BASIC METABOLIC PANEL
BUN: 18 mg/dL (ref 6–23)
CO2: 27 mEq/L (ref 19–32)
Calcium: 10 mg/dL (ref 8.4–10.5)
Chloride: 101 mEq/L (ref 96–112)
Creatinine, Ser: 1.07 mg/dL (ref 0.40–1.50)
GFR: 73.01 mL/min (ref >60.00)
Glucose, Bld: 144 mg/dL — ABNORMAL HIGH (ref 70–99)
Potassium: 4.2 mEq/L (ref 3.5–5.1)
Sodium: 136 mEq/L (ref 135–145)

## 2022-05-15 ENCOUNTER — Other Ambulatory Visit: Payer: Self-pay

## 2022-05-15 ENCOUNTER — Encounter (HOSPITAL_BASED_OUTPATIENT_CLINIC_OR_DEPARTMENT_OTHER): Payer: Self-pay

## 2022-05-15 ENCOUNTER — Emergency Department (HOSPITAL_BASED_OUTPATIENT_CLINIC_OR_DEPARTMENT_OTHER)
Admission: EM | Admit: 2022-05-15 | Discharge: 2022-05-15 | Disposition: A | Discharge status: Home / Self Care | Payer: Medicare Other | Attending: Emergency Medicine | Admitting: Emergency Medicine

## 2022-05-15 ENCOUNTER — Emergency Department (HOSPITAL_BASED_OUTPATIENT_CLINIC_OR_DEPARTMENT_OTHER): Payer: Medicare Other

## 2022-05-15 DIAGNOSIS — C259 Malignant neoplasm of pancreas, unspecified: Secondary | ICD-10-CM | POA: Diagnosis not present

## 2022-05-15 DIAGNOSIS — N130 Hydronephrosis with ureteropelvic junction obstruction: Secondary | ICD-10-CM | POA: Diagnosis not present

## 2022-05-15 DIAGNOSIS — E876 Hypokalemia: Secondary | ICD-10-CM | POA: Insufficient documentation

## 2022-05-15 DIAGNOSIS — R748 Abnormal levels of other serum enzymes: Secondary | ICD-10-CM | POA: Insufficient documentation

## 2022-05-15 DIAGNOSIS — C799 Secondary malignant neoplasm of unspecified site: Secondary | ICD-10-CM | POA: Diagnosis not present

## 2022-05-15 DIAGNOSIS — D649 Anemia, unspecified: Secondary | ICD-10-CM | POA: Insufficient documentation

## 2022-05-15 DIAGNOSIS — R1032 Left lower quadrant pain: Secondary | ICD-10-CM

## 2022-05-15 DIAGNOSIS — K5900 Constipation, unspecified: Secondary | ICD-10-CM | POA: Diagnosis not present

## 2022-05-15 DIAGNOSIS — N135 Crossing vessel and stricture of ureter without hydronephrosis: Secondary | ICD-10-CM

## 2022-05-15 DIAGNOSIS — R109 Unspecified abdominal pain: Secondary | ICD-10-CM | POA: Diagnosis present

## 2022-05-15 LAB — URINALYSIS, ROUTINE W REFLEX MICROSCOPIC
Bilirubin Urine: NEGATIVE
Glucose, UA: NEGATIVE mg/dL
Ketones, ur: NEGATIVE mg/dL
Nitrite: NEGATIVE
Protein, ur: NEGATIVE mg/dL
Specific Gravity, Urine: 1.01 (ref 1.005–1.030)
pH: 5.5 (ref 5.0–8.0)

## 2022-05-15 LAB — CBC WITH DIFFERENTIAL/PLATELET
Abs Immature Granulocytes: 0.02 10*3/uL (ref 0.00–0.07)
Basophils Absolute: 0 10*3/uL (ref 0.0–0.1)
Basophils Relative: 1 %
Eosinophils Absolute: 0.1 10*3/uL (ref 0.0–0.5)
Eosinophils Relative: 1 %
HCT: 37.1 % — ABNORMAL LOW (ref 39.0–52.0)
Hemoglobin: 12.7 g/dL — ABNORMAL LOW (ref 13.0–17.0)
Immature Granulocytes: 0 %
Lymphocytes Relative: 22 %
Lymphs Abs: 1.4 10*3/uL (ref 0.7–4.0)
MCH: 30.3 pg (ref 26.0–34.0)
MCHC: 34.2 g/dL (ref 30.0–36.0)
MCV: 88.5 fL (ref 80.0–100.0)
Monocytes Absolute: 0.6 10*3/uL (ref 0.1–1.0)
Monocytes Relative: 10 %
Neutro Abs: 4.3 10*3/uL (ref 1.7–7.7)
Neutrophils Relative %: 66 %
Platelets: 251 10*3/uL (ref 150–400)
RBC: 4.19 MIL/uL — ABNORMAL LOW (ref 4.22–5.81)
RDW: 12.3 % (ref 11.5–15.5)
WBC: 6.5 10*3/uL (ref 4.0–10.5)
nRBC: 0 % (ref 0.0–0.2)

## 2022-05-15 LAB — COMPREHENSIVE METABOLIC PANEL
ALT: 21 U/L (ref 0–44)
AST: 18 U/L (ref 15–41)
Albumin: 4 g/dL (ref 3.5–5.0)
Alkaline Phosphatase: 72 U/L (ref 38–126)
Anion gap: 11 (ref 5–15)
BUN: 18 mg/dL (ref 8–23)
CO2: 25 mmol/L (ref 22–32)
Calcium: 9.7 mg/dL (ref 8.9–10.3)
Chloride: 99 mmol/L (ref 98–111)
Creatinine, Ser: 0.86 mg/dL (ref 0.61–1.24)
GFR, Estimated: >60 mL/min (ref >60)
Glucose, Bld: 106 mg/dL — ABNORMAL HIGH (ref 70–99)
Potassium: 3.3 mmol/L — ABNORMAL LOW (ref 3.5–5.1)
Sodium: 135 mmol/L (ref 135–145)
Total Bilirubin: 0.5 mg/dL (ref 0.3–1.2)
Total Protein: 6.9 g/dL (ref 6.5–8.1)

## 2022-05-15 LAB — URINALYSIS, MICROSCOPIC (REFLEX): Bacteria, UA: NONE SEEN

## 2022-05-15 LAB — LIPASE, BLOOD: Lipase: 106 U/L — ABNORMAL HIGH (ref 11–51)

## 2022-05-15 MED ORDER — ZOLPIDEM TARTRATE 5 MG PO TABS: 5.0000 mg | ORAL_TABLET | Freq: Every evening | ORAL | 0 refills | Status: DC | PRN

## 2022-05-15 MED ORDER — IOHEXOL 300 MG/ML  SOLN
100.0000 mL | Freq: Once | INTRAMUSCULAR | Status: AC | PRN
  Administered 2022-05-15: 100 mL via INTRAVENOUS

## 2022-05-15 MED ORDER — SODIUM CHLORIDE 0.9 % IV BOLUS
1000.0000 mL | Freq: Once | INTRAVENOUS | Status: AC
  Administered 2022-05-15: 1000 mL via INTRAVENOUS

## 2022-05-15 MED ORDER — METHOCARBAMOL 500 MG PO TABS
500.0000 mg | ORAL_TABLET | Freq: Two times a day (BID) | ORAL | 0 refills | Status: DC
Start: 2022-05-15 — End: 2022-07-06

## 2022-05-15 MED ORDER — HEPARIN SOD (PORK) LOCK FLUSH 100 UNIT/ML IV SOLN
500.0000 [IU] | INTRAVENOUS | Status: AC | PRN
  Administered 2022-05-15: 500 [IU]
  Filled 2022-05-15: qty 5

## 2022-05-15 MED ORDER — HYDROCODONE-ACETAMINOPHEN 5-325 MG PO TABS: 1.0000 | ORAL_TABLET | ORAL | 0 refills | Status: DC | PRN

## 2022-05-15 NOTE — Discharge Instructions (Addendum)
Keep your appointment on Wednesday with the urologist at [facility].

## 2022-05-15 NOTE — ED Triage Notes (Signed)
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

## 2022-05-15 NOTE — ED Provider Notes (Signed)
[facility]ERGENC[facility] DEPT Provider Note   CSN: [id number] Arrival date & time: 05/15/22  1518     History  Chief Complaint  Patient presents with   Abdominal Pain    Micheal Guerrero is a 66 y.o. male.  Pt is a 66 yo male with a pmhx significant for pancreatic cancer with mets.  Pt also has a hx of hyperlipidemia and diverticulosis.  Pt presents to the ED today with abd pain.  Pt has had decreased output from his ostomy, but is still having some output.  He started getting pain on Wednesday, June 7.  Pt said it had been intermittent, but it is now constant.  The pt did call his oncologist at [facility] who recommended coming in to check for sbo.  Pt is not currently getting chemo.  He denies fevers. He has nausea, but no vomiting.       Home Medications Prior to Admission medications   Medication Sig Start Date End Date Taking? Authorizing Provider  HYDROcodone-acetaminophen (NORCO/VICODIN) 5-325 MG tablet Take 1 tablet by mouth every 4 (four) hours as needed. 05/15/22  Yes Isla Pence, MD  methocarbamol (ROBAXIN) 500 MG tablet Take 1 tablet (500 mg total) by mouth 2 (two) times daily. 05/15/22  Yes Isla Pence, MD  zolpidem (AMBIEN) 5 MG tablet Take 1 tablet (5 mg total) by mouth at bedtime as needed for sleep. 05/15/22 06/14/22 Yes Isla Pence, MD  acetaminophen (TYLENOL) 500 MG tablet Take by mouth.    [provider]  dexamethasone (DECADRON) 4 MG tablet Take by mouth. Patient not taking: Reported on 11/30/2021 05/10/21   [provider]  famotidine (PEPCID) 20 MG tablet Take by mouth.    [provider]  ondansetron (ZOFRAN-ODT) 8 MG disintegrating tablet Take by mouth. Patient not taking: Reported on 11/30/2021 05/10/21   [provider]  prochlorperazine (COMPAZINE) 10 MG tablet Take by mouth. Patient not taking: Reported on 11/30/2021 04/18/21   [provider]  senna (SENOKOT) 8.6 MG tablet Take by  mouth. Patient not taking: Reported on 11/30/2021 05/10/21   [provider]  Sod Picosulfate-Mag Ox-Cit Acd (CLENPIQ) 10-3.5-12 MG-GM -GM/160ML SOLN Take 1 kit by mouth as directed. Patient not taking: Reported on 11/30/2021 03/30/21   Cirigliano, Luanna Salk V, DO  tamsulosin (FLOMAX) 0.4 MG CAPS capsule Take 0.4 mg by mouth daily. Patient not taking: Reported on 11/30/2021 11/01/21   [provider]      Allergies    Patient has no known allergies.    Review of Systems   Review of Systems  Gastrointestinal:  Positive for abdominal pain, nausea and vomiting.  All other systems reviewed and are negative.   Physical Exam Updated Vital Signs BP 131/87   Pulse 87   Temp 98.8 F (37.1 C) (Oral)   Resp 18   Ht 5' 9" (1.753 m)   Wt 68 kg   SpO2 99%   BMI 22.15 kg/m  Physical Exam Vitals and nursing note reviewed.  Constitutional:      Appearance: He is well-developed.  HENT:     Head: Normocephalic and atraumatic.     Mouth/Throat:     Mouth: Mucous membranes are moist.     Pharynx: Oropharynx is clear.  Eyes:     Extraocular Movements: Extraocular movements intact.     Pupils: Pupils are equal, round, and reactive to light.  Cardiovascular:     Rate and Rhythm: Normal rate and regular rhythm.  Pulmonary:  Effort: Pulmonary effort is normal.     Breath sounds: Normal breath sounds.  Abdominal:     General: Abdomen is flat. Bowel sounds are normal.     Palpations: Abdomen is soft.     Tenderness: There is abdominal tenderness in the left lower quadrant.  Skin:    General: Skin is warm.     Capillary Refill: Capillary refill takes less than 2 seconds.  Neurological:     General: No focal deficit present.     Mental Status: He is alert and oriented to person, place, and time.  Psychiatric:        Mood and Affect: Mood normal.        Behavior: Behavior normal.     ED Results / Procedures / Treatments   Labs (all labs ordered are listed, but only  abnormal results are displayed) Labs Reviewed  CBC WITH DIFFERENTIAL/PLATELET - Abnormal; Notable for the following components:      Result Value   RBC 4.19 (*)    Hemoglobin 12.7 (*)    HCT 37.1 (*)    All other components within normal limits  COMPREHENSIVE METABOLIC PANEL - Abnormal; Notable for the following components:   Potassium 3.3 (*)    Glucose, Bld 106 (*)    All other components within normal limits  LIPASE, BLOOD - Abnormal; Notable for the following components:   Lipase 106 (*)    All other components within normal limits  URINALYSIS, ROUTINE W REFLEX MICROSCOPIC - Abnormal; Notable for the following components:   Hgb urine dipstick SMALL (*)    Leukocytes,Ua TRACE (*)    All other components within normal limits  URINALYSIS, MICROSCOPIC (REFLEX)    EKG None  Radiology CT ABDOMEN PELVIS W CONTRAST  Result Date: 05/15/2022 CLINICAL DATA:  Bowel obstruction suspected, lower abdominal cramping, decreased colostomy output, history of metastatic pancreatic cancer, status post Whipple procedure, descending colon obstruction secondary to metastasis pending chemotherapy * Tracking Code: BO * EXAM: CT ABDOMEN AND PELVIS WITH CONTRAST TECHNIQUE: Multidetector CT imaging of the abdomen and pelvis was performed using the standard protocol following bolus administration of intravenous contrast. RADIATION DOSE REDUCTION: This exam was performed according to the departmental dose-optimization program which includes automated exposure control, adjustment of the mA and/or kV according to patient size and/or use of iterative reconstruction technique. CONTRAST:  137m OMNIPAQUE IOHEXOL 300 MG/ML  SOLN COMPARISON:  03/30/2021 FINDINGS: Lower chest: No acute abnormality. Hepatobiliary: No focal liver abnormality is seen. Status post cholecystectomy and hepatojejunostomy. No biliary dilatation. Pancreas: Status post Whipple pancreaticoduodenectomy. Dilatation of the main pancreatic duct measuring  up to 0.7 cm (series 2, image 24). Spleen: Normal in size without significant abnormality. Adrenals/Urinary Tract: Adrenal glands are unremarkable. Severe left hydronephrosis and hydroureter to the distal third of the ureter, with delayed nephrogram and double-J ureteral stent catheter, formed pigtail in a low lying position near the ureteropelvic junction, and within the urinary bladder. No right-sided hydronephrosis. Bladder is unremarkable. Stomach/Bowel: Status post distal gastrectomy and gastrojejunostomy. Diverting transverse colostomy in the right hemiabdomen (series 2, image 44). The distal transverse colon and proximal sigmoid colon are fluid-filled and mildly distended, with severe, masslike long segment thickening of the sigmoid colon and rectum (series 2, image 65, series 5, image 21). Suspect an exophytic mass within the distal sigmoid mesocolon near the rectosigmoid junction, which appears to obstruct the distal left ureter, measuring 3.8 x 2.9 cm (series 2, image 63). Vascular/Lymphatic: No significant vascular findings are present. No enlarged  abdominal or pelvic lymph nodes. Reproductive: No mass or other significant abnormality. Other: No abdominal wall hernia or abnormality. Small volume ascites throughout the abdomen and pelvis. Stranding and nodularity along the peritoneal surfaces and in the left upper quadrant (series 2, image 15). Musculoskeletal: No acute or significant osseous findings. IMPRESSION: 1. The distal transverse colon and proximal sigmoid colon are fluid-filled and mildly distended, with severe, masslike long segment thickening of the sigmoid colon and rectum. Suspect an exophytic mass within the distal sigmoid mesocolon near the rectosigmoid junction, which appears to obstruct the distal left ureter. Although somewhat unusual as a site of metastasis this is generally in keeping with reported, known pancreatic cancer metastasis complicated by bowel and ureteral obstruction. 2.  Severe left hydronephrosis and hydroureter to the distal third of the ureter, with delayed nephrogram and double-J ureteral stent catheter, formed pigtail in a low lying position near the ureteropelvic junction, and within the urinary bladder. No right-sided hydronephrosis. 3. Status post diverting transverse colostomy. The distal transverse and descending colon are fluid-filled proximal to the site of sigmoid obstipation. 4. Small volume ascites throughout the abdomen and pelvis with stranding and nodularity in the left upper quadrant, in keeping with peritoneal metastatic disease. 5. Status post Whipple pancreaticoduodenectomy. Presumably postoperative dilatation of the main pancreatic duct measuring up to 0.7 cm. Electronically Signed   By: Delanna Ahmadi M.D.   On: 05/15/2022 17:37    Procedures Procedures    Medications Ordered in ED Medications  sodium chloride 0.9 % bolus 1,000 mL (0 mLs Intravenous Stopped 05/15/22 1748)  iohexol (OMNIPAQUE) 300 MG/ML solution 100 mL (100 mLs Intravenous Contrast Given 05/15/22 1712)  heparin lock flush 100 unit/mL (500 Units Intracatheter Given 05/15/22 1839)    ED Course/ Medical Decision Making/ A&P                           Medical Decision Making Amount and/or Complexity of Data Reviewed Labs: ordered. Radiology: ordered.  Risk Prescription drug management.   This patient presents to the ED for concern of abd pain, this involves an extensive number of treatment options, and is a complaint that carries with it a high risk of complications and morbidity.  The differential diagnosis includes sbo, carcinomatosis, infection   Co morbidities that complicate the patient evaluation  Pancreatic cancer with mets   Additional history obtained:  Additional history obtained from epic chart review and wife  Lab Tests:  I Ordered, and personally interpreted labs.  The pertinent results include:  cbc with mild, chronic anemia (hgb 12.7); cmp nl other  than mild hypokalemia; ua nl; lip elevated at 106   Imaging Studies ordered:  I ordered imaging studies including ct abd/pelvis  I independently visualized and interpreted imaging which showed  IMPRESSION:  1. The distal transverse colon and proximal sigmoid colon are  fluid-filled and mildly distended, with severe, masslike long  segment thickening of the sigmoid colon and rectum. Suspect an  exophytic mass within the distal sigmoid mesocolon near the  rectosigmoid junction, which appears to obstruct the distal left  ureter. Although somewhat unusual as a site of metastasis this is  generally in keeping with reported, known pancreatic cancer  metastasis complicated by bowel and ureteral obstruction.  2. Severe left hydronephrosis and hydroureter to the distal third of  the ureter, with delayed nephrogram and double-J ureteral stent  catheter, formed pigtail in a low lying position near the  ureteropelvic junction, and within  the urinary bladder. No  right-sided hydronephrosis.  3. Status post diverting transverse colostomy. The distal transverse  and descending colon are fluid-filled proximal to the site of  sigmoid obstipation.  4. Small volume ascites throughout the abdomen and pelvis with  stranding and nodularity in the left upper quadrant, in keeping with  peritoneal metastatic disease.  5. Status post Whipple pancreaticoduodenectomy. Presumably  postoperative dilatation of the main pancreatic duct measuring up to  0.7 cm.   I agree with the radiologist interpretation   Cardiac Monitoring:  The patient was maintained on a cardiac monitor.  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  Pt said he had a CT of his abd a few weeks ago at [facility] which showed the ureteral obstruction.  He is scheduled to have his stent replaced on 6/14.  So, that seems chronic.  Pt's pain is well controlled here and he did not want anything for pain.  No evidence of sbo on CT.  Pt has zofran at home.  He feels much better.  He is going to be d/c with instructions to take colace and miralax (both of which he has at home).  He is to return if sx worsen.  Pt did request some pain meds for home if the pain comes back.  He is given a rx for these meds, but he is also told this may cause worsening constipation.  He said he would only take them if he could not sleep due to the pain.    Reevaluation:  After the interventions noted above, I reevaluated the patient and found that they have :improved   Social Determinants of Health:  Lives at home with wife   Dispostion:  After consideration of the diagnostic results and the patients response to treatment, I feel that the patent would benefit from discharge with outpatient f/u.          Final Clinical Impression(s) / ED Diagnoses Final diagnoses:  Metastasis from pancreatic cancer ([place])  Left lower quadrant abdominal pain  Constipation, unspecified constipation type  Ureteral obstruction, left    Rx / DC Orders ED Discharge Orders          Ordered    HYDROcodone-acetaminophen (NORCO/VICODIN) 5-325 MG tablet  Every 4 hours PRN        05/15/22 1820    zolpidem (AMBIEN) 5 MG tablet  At bedtime PRN        05/15/22 1820    methocarbamol (ROBAXIN) 500 MG tablet  2 times daily        05/15/22 1820              Isla Pence, MD 05/15/22 1900

## 2022-07-03 ENCOUNTER — Other Ambulatory Visit: Payer: Self-pay

## 2022-07-03 ENCOUNTER — Encounter (HOSPITAL_BASED_OUTPATIENT_CLINIC_OR_DEPARTMENT_OTHER): Payer: Self-pay | Admitting: Emergency Medicine

## 2022-07-03 ENCOUNTER — Inpatient Hospital Stay (HOSPITAL_BASED_OUTPATIENT_CLINIC_OR_DEPARTMENT_OTHER)
Admission: EM | Admit: 2022-07-03 | Discharge: 2022-07-06 | DRG: 638 | Disposition: A | Discharge status: Other Acute Inpatient Hospital | Payer: Medicare Other | Attending: Internal Medicine | Admitting: Internal Medicine

## 2022-07-03 DIAGNOSIS — E785 Hyperlipidemia, unspecified: Secondary | ICD-10-CM | POA: Diagnosis present

## 2022-07-03 DIAGNOSIS — Z8507 Personal history of malignant neoplasm of pancreas: Secondary | ICD-10-CM

## 2022-07-03 DIAGNOSIS — Z681 Body mass index (BMI) 19 or less, adult: Secondary | ICD-10-CM | POA: Diagnosis not present

## 2022-07-03 DIAGNOSIS — C786 Secondary malignant neoplasm of retroperitoneum and peritoneum: Secondary | ICD-10-CM | POA: Diagnosis present

## 2022-07-03 DIAGNOSIS — Z820 Family history of epilepsy and other diseases of the nervous system: Secondary | ICD-10-CM | POA: Diagnosis not present

## 2022-07-03 DIAGNOSIS — Z933 Colostomy status: Secondary | ICD-10-CM

## 2022-07-03 DIAGNOSIS — R748 Abnormal levels of other serum enzymes: Secondary | ICD-10-CM

## 2022-07-03 DIAGNOSIS — T380X5A Adverse effect of glucocorticoids and synthetic analogues, initial encounter: Secondary | ICD-10-CM | POA: Diagnosis present

## 2022-07-03 DIAGNOSIS — K219 Gastro-esophageal reflux disease without esophagitis: Secondary | ICD-10-CM | POA: Diagnosis present

## 2022-07-03 DIAGNOSIS — Z79899 Other long term (current) drug therapy: Secondary | ICD-10-CM

## 2022-07-03 DIAGNOSIS — D6959 Other secondary thrombocytopenia: Secondary | ICD-10-CM | POA: Diagnosis present

## 2022-07-03 DIAGNOSIS — R531 Weakness: Secondary | ICD-10-CM | POA: Diagnosis not present

## 2022-07-03 DIAGNOSIS — E860 Dehydration: Secondary | ICD-10-CM | POA: Diagnosis present

## 2022-07-03 DIAGNOSIS — N179 Acute kidney failure, unspecified: Secondary | ICD-10-CM | POA: Diagnosis present

## 2022-07-03 DIAGNOSIS — R64 Cachexia: Secondary | ICD-10-CM | POA: Diagnosis present

## 2022-07-03 DIAGNOSIS — R54 Age-related physical debility: Secondary | ICD-10-CM | POA: Diagnosis present

## 2022-07-03 DIAGNOSIS — R739 Hyperglycemia, unspecified: Principal | ICD-10-CM

## 2022-07-03 DIAGNOSIS — Z87891 Personal history of nicotine dependence: Secondary | ICD-10-CM | POA: Diagnosis not present

## 2022-07-03 DIAGNOSIS — E1110 Type 2 diabetes mellitus with ketoacidosis without coma: Secondary | ICD-10-CM | POA: Diagnosis present

## 2022-07-03 DIAGNOSIS — E0810 Diabetes mellitus due to underlying condition with ketoacidosis without coma: Secondary | ICD-10-CM | POA: Diagnosis not present

## 2022-07-03 DIAGNOSIS — E870 Hyperosmolality and hypernatremia: Secondary | ICD-10-CM | POA: Diagnosis present

## 2022-07-03 LAB — BASIC METABOLIC PANEL
Anion gap: 19 — ABNORMAL HIGH (ref 5–15)
BUN: 61 mg/dL — ABNORMAL HIGH (ref 8–23)
CO2: 21 mmol/L — ABNORMAL LOW (ref 22–32)
Calcium: 10.5 mg/dL — ABNORMAL HIGH (ref 8.9–10.3)
Chloride: 108 mmol/L (ref 98–111)
Creatinine, Ser: 1.53 mg/dL — ABNORMAL HIGH (ref 0.61–1.24)
GFR, Estimated: 50 mL/min — ABNORMAL LOW (ref >60)
Glucose, Bld: 596 mg/dL (ref 70–99)
Potassium: 4.5 mmol/L (ref 3.5–5.1)
Sodium: 148 mmol/L — ABNORMAL HIGH (ref 135–145)

## 2022-07-03 LAB — COMPREHENSIVE METABOLIC PANEL
ALT: 46 U/L — ABNORMAL HIGH (ref 0–44)
AST: 14 U/L — ABNORMAL LOW (ref 15–41)
Albumin: 4.1 g/dL (ref 3.5–5.0)
Alkaline Phosphatase: 123 U/L (ref 38–126)
Anion gap: 24 — ABNORMAL HIGH (ref 5–15)
BUN: 68 mg/dL — ABNORMAL HIGH (ref 8–23)
CO2: 21 mmol/L — ABNORMAL LOW (ref 22–32)
Calcium: 11.5 mg/dL — ABNORMAL HIGH (ref 8.9–10.3)
Chloride: 101 mmol/L (ref 98–111)
Creatinine, Ser: 2.12 mg/dL — ABNORMAL HIGH (ref 0.61–1.24)
GFR, Estimated: 34 mL/min — ABNORMAL LOW (ref >60)
Glucose, Bld: 746 mg/dL (ref 70–99)
Potassium: 4.6 mmol/L (ref 3.5–5.1)
Sodium: 146 mmol/L — ABNORMAL HIGH (ref 135–145)
Total Bilirubin: 0.5 mg/dL (ref 0.3–1.2)
Total Protein: 8.4 g/dL — ABNORMAL HIGH (ref 6.5–8.1)

## 2022-07-03 LAB — I-STAT VENOUS BLOOD GAS, ED
Acid-base deficit: 3 mmol/L — ABNORMAL HIGH (ref 0.0–2.0)
Bicarbonate: 23.2 mmol/L (ref 20.0–28.0)
Calcium, Ion: 1.27 mmol/L (ref 1.15–1.40)
HCT: 44 % (ref 39.0–52.0)
Hemoglobin: 15 g/dL (ref 13.0–17.0)
O2 Saturation: 19 %
Patient temperature: 98.2
Potassium: 4.5 mmol/L (ref 3.5–5.1)
Sodium: 145 mmol/L (ref 135–145)
TCO2: 24 mmol/L (ref 22–32)
pCO2, Ven: 41.9 mmHg — ABNORMAL LOW (ref 44–60)
pH, Ven: 7.35 (ref 7.25–7.43)
pO2, Ven: 16 mmHg — CL (ref 32–45)

## 2022-07-03 LAB — HEMOGLOBIN A1C
Hgb A1c MFr Bld: 8.5 % — ABNORMAL HIGH (ref 4.8–5.6)
Mean Plasma Glucose: 197.25 mg/dL

## 2022-07-03 LAB — CBC WITH DIFFERENTIAL/PLATELET
Abs Immature Granulocytes: 0.12 10*3/uL — ABNORMAL HIGH (ref 0.00–0.07)
Basophils Absolute: 0 10*3/uL (ref 0.0–0.1)
Basophils Relative: 0 %
Eosinophils Absolute: 0 10*3/uL (ref 0.0–0.5)
Eosinophils Relative: 0 %
HCT: 45 % (ref 39.0–52.0)
Hemoglobin: 14.4 g/dL (ref 13.0–17.0)
Immature Granulocytes: 1 %
Lymphocytes Relative: 11 %
Lymphs Abs: 0.9 10*3/uL (ref 0.7–4.0)
MCH: 28.3 pg (ref 26.0–34.0)
MCHC: 32 g/dL (ref 30.0–36.0)
MCV: 88.6 fL (ref 80.0–100.0)
Monocytes Absolute: 0.3 10*3/uL (ref 0.1–1.0)
Monocytes Relative: 3 %
Neutro Abs: 7 10*3/uL (ref 1.7–7.7)
Neutrophils Relative %: 85 %
Platelets: 340 10*3/uL (ref 150–400)
RBC: 5.08 MIL/uL (ref 4.22–5.81)
RDW: 12.9 % (ref 11.5–15.5)
WBC: 8.3 10*3/uL (ref 4.0–10.5)
nRBC: 0 % (ref 0.0–0.2)

## 2022-07-03 LAB — CBG MONITORING, ED
Glucose-Capillary: >600 mg/dL (ref 70–99)
Glucose-Capillary: 532 mg/dL (ref 70–99)
Glucose-Capillary: 509 mg/dL (ref 70–99)
Glucose-Capillary: 429 mg/dL — ABNORMAL HIGH (ref 70–99)
Glucose-Capillary: 464 mg/dL — ABNORMAL HIGH (ref 70–99)

## 2022-07-03 LAB — URINALYSIS, ROUTINE W REFLEX MICROSCOPIC
Bilirubin Urine: NEGATIVE
Glucose, UA: >1000 mg/dL — AB
Ketones, ur: 15 mg/dL — AB
Leukocytes,Ua: NEGATIVE
Nitrite: NEGATIVE
Protein, ur: 30 mg/dL — AB
Specific Gravity, Urine: 1.027 (ref 1.005–1.030)
pH: 5 (ref 5.0–8.0)

## 2022-07-03 LAB — GLUCOSE, CAPILLARY
Glucose-Capillary: 330 mg/dL — ABNORMAL HIGH (ref 70–99)
Glucose-Capillary: 406 mg/dL — ABNORMAL HIGH (ref 70–99)

## 2022-07-03 LAB — BETA-HYDROXYBUTYRIC ACID: Beta-Hydroxybutyric Acid: 3.66 mmol/L — ABNORMAL HIGH (ref 0.05–0.27)

## 2022-07-03 LAB — LIPASE, BLOOD: Lipase: 296 U/L — ABNORMAL HIGH (ref 11–51)

## 2022-07-03 MED ORDER — CHLORHEXIDINE GLUCONATE CLOTH 2 % EX PADS
6.0000 | MEDICATED_PAD | Freq: Every day | CUTANEOUS | Status: DC
  Administered 2022-07-03 – 2022-07-05 (×4): 6 via TOPICAL
  Filled 2022-07-03: qty 6

## 2022-07-03 MED ORDER — ORAL CARE MOUTH RINSE
15.0000 mL | OROMUCOSAL | Status: DC | PRN
  Filled 2022-07-03: qty 15

## 2022-07-03 MED ORDER — LACTATED RINGERS IV BOLUS
1000.0000 mL | Freq: Once | INTRAVENOUS | Status: AC
  Administered 2022-07-03: 1000 mL via INTRAVENOUS

## 2022-07-03 MED ORDER — DEXTROSE 50 % IV SOLN: 0.0000 mL | INTRAVENOUS | Status: DC | PRN

## 2022-07-03 MED ORDER — LACTATED RINGERS IV SOLN: INTRAVENOUS | Status: DC

## 2022-07-03 MED ORDER — DEXTROSE IN LACTATED RINGERS 5 % IV SOLN: INTRAVENOUS | Status: DC

## 2022-07-03 MED ORDER — INSULIN REGULAR(HUMAN) IN NACL 100-0.9 UT/100ML-% IV SOLN
INTRAVENOUS | Status: DC
  Administered 2022-07-03: 4.8 [IU]/h via INTRAVENOUS
  Filled 2022-07-03: qty 100

## 2022-07-03 MED ORDER — LACTATED RINGERS IV BOLUS
1000.0000 mL | Freq: Once | INTRAVENOUS | Status: AC
Start: 2022-07-03 — End: 2022-07-03
  Administered 2022-07-03: 1000 mL via INTRAVENOUS

## 2022-07-03 NOTE — ED Notes (Signed)
Called [facility] for consult/admission per PA at 812pm

## 2022-07-03 NOTE — ED Notes (Signed)
Date and time results received: 07/03/22 2109 (use smartphrase ".now" to insert current time)  Test: glucose Critical Value: 596  Name of Provider Notified: Alvera Singh, PA  Orders Received? Or Actions Taken?:  n/a

## 2022-07-03 NOTE — ED Provider Notes (Signed)
[facility] EMERGENCY DEPT Provider Note   CSN: [id number] Arrival date & time: 07/03/22  1721     History  Chief Complaint  Patient presents with   Weakness    Micheal Guerrero is a 66 y.o. male.  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  Patient has been on dexamethasone status post chemoinfusion.  He has had several weeks of decreasing appetite, trying to supplement with protein shakes, but overall poor oral intake recently.  No vomiting but feels nauseous and has had recent difficulty with early satiety.  No history of diabetes.  He has worsened over the day today, prompting emergency department visit.  Family was concerned that he was becoming dehydrated.  Patient reports dry mouth, increased urination.    Oncology team at [facility]: Dr. Earnestine Mealing and Joyce Gross NP.     Home Medications Prior to Admission medications   Medication Sig Start Date End Date Taking? Authorizing Provider  acetaminophen (TYLENOL) 500 MG tablet Take by mouth.    [provider]  dexamethasone (DECADRON) 4 MG tablet Take by mouth. Patient not taking: Reported on 11/30/2021 05/10/21   [provider]  famotidine (PEPCID) 20 MG tablet Take by mouth.    [provider]  HYDROcodone-acetaminophen (NORCO/VICODIN) 5-325 MG tablet Take 1 tablet by mouth every 4 (four) hours as needed. 05/15/22   Isla Pence, MD  methocarbamol (ROBAXIN) 500 MG tablet Take 1 tablet (500 mg total) by mouth 2 (two) times daily. 05/15/22   Isla Pence, MD  ondansetron (ZOFRAN-ODT) 8 MG disintegrating tablet Take by mouth. Patient not taking: Reported on 11/30/2021 05/10/21   [provider]  prochlorperazine (COMPAZINE) 10 MG tablet Take by mouth. Patient not  taking: Reported on 11/30/2021 04/18/21   [provider]  senna (SENOKOT) 8.6 MG tablet Take by mouth. Patient not taking: Reported on 11/30/2021 05/10/21   [provider]  Sod Picosulfate-Mag Ox-Cit Acd (CLENPIQ) 10-3.5-12 MG-GM -GM/160ML SOLN Take 1 kit by mouth as directed. Patient not taking: Reported on 11/30/2021 03/30/21   Cirigliano, Luanna Salk V, DO  tamsulosin (FLOMAX) 0.4 MG CAPS capsule Take 0.4 mg by mouth daily. Patient not taking: Reported on 11/30/2021 11/01/21   [provider]  zolpidem (AMBIEN) 5 MG tablet Take 1 tablet (5 mg total) by mouth at bedtime as needed for sleep. 05/15/22 06/14/22  Isla Pence, MD      Allergies    Patient has no known allergies.    Review of Systems   Review of Systems  Physical Exam Updated Vital Signs BP 100/85 (BP Location: Left Arm)   Pulse (!) 132   Temp 98.2 F (36.8 C)   Resp 16   Ht 5' 9"  (1.753 m)   Wt 59.4 kg   SpO2 95%   BMI 19.35 kg/m   Physical Exam Vitals and nursing note reviewed.  Constitutional:      General: He is not in acute distress.    Appearance: He is well-developed.  HENT:     Head: Normocephalic and atraumatic.     Nose: No congestion or rhinorrhea.     Mouth/Throat:     Mouth: Mucous membranes are dry.  Eyes:     General:        Right eye: No discharge.        Left eye: No discharge.  Conjunctiva/sclera: Conjunctivae normal.  Cardiovascular:     Rate and Rhythm: Regular rhythm. Tachycardia present.     Heart sounds: Normal heart sounds.  Pulmonary:     Effort: Pulmonary effort is normal. Tachypnea present.     Breath sounds: Normal breath sounds.  Abdominal:     Palpations: Abdomen is soft.     Tenderness: There is no abdominal tenderness.  Musculoskeletal:     Cervical back: Normal range of motion and neck supple.  Skin:    General: Skin is warm and dry.  Neurological:     Mental Status: He is alert.     ED Results / Procedures / Treatments   Labs (all  labs ordered are listed, but only abnormal results are displayed) Labs Reviewed  COMPREHENSIVE METABOLIC PANEL - Abnormal; Notable for the following components:      Result Value   Sodium 146 (*)    CO2 21 (*)    Glucose, Bld 746 (*)    BUN 68 (*)    Creatinine, Ser 2.12 (*)    Calcium 11.5 (*)    Total Protein 8.4 (*)    AST 14 (*)    ALT 46 (*)    GFR, Estimated 34 (*)    Anion gap 24 (*)    All other components within normal limits  LIPASE, BLOOD - Abnormal; Notable for the following components:   Lipase 296 (*)    All other components within normal limits  CBC WITH DIFFERENTIAL/PLATELET - Abnormal; Notable for the following components:   Abs Immature Granulocytes 0.12 (*)    All other components within normal limits  URINALYSIS, ROUTINE W REFLEX MICROSCOPIC - Abnormal; Notable for the following components:   APPearance HAZY (*)    Glucose, UA >1,000 (*)    Hgb urine dipstick LARGE (*)    Ketones, ur 15 (*)    Protein, ur 30 (*)    All other components within normal limits  BASIC METABOLIC PANEL - Abnormal; Notable for the following components:   Sodium 148 (*)    CO2 21 (*)    Glucose, Bld 596 (*)    BUN 61 (*)    Creatinine, Ser 1.53 (*)    Calcium 10.5 (*)    GFR, Estimated 50 (*)    Anion gap 19 (*)    All other components within normal limits  CBG MONITORING, ED - Abnormal; Notable for the following components:   Glucose-Capillary >600 (*)    All other components within normal limits  I-STAT VENOUS BLOOD GAS, ED - Abnormal; Notable for the following components:   pCO2, Ven 41.9 (*)    pO2, Ven 16 (*)    Acid-base deficit 3.0 (*)    All other components within normal limits  CBG MONITORING, ED - Abnormal; Notable for the following components:   Glucose-Capillary 532 (*)    All other components within normal limits  CBG MONITORING, ED - Abnormal; Notable for the following components:   Glucose-Capillary 509 (*)    All other components within normal limits   CBG MONITORING, ED - Abnormal; Notable for the following components:   Glucose-Capillary 464 (*)    All other components within normal limits  BETA-HYDROXYBUTYRIC ACID  HEMOGLOBIN A1C    EKG EKG Interpretation  Date/Time:  Monday July 03 2022 17:53:01 EDT Ventricular Rate:  134 PR Interval:  130 QRS Duration: 68 QT Interval:  302 QTC Calculation: 450 R Axis:   49 Text Interpretation: Sinus tachycardia Nonspecific ST and  T wave abnormality Abnormal ECG No previous ECGs available Confirmed by Gareth Morgan [phone]) on 07/03/2022 6:50:21 PM  Radiology No results found.  Procedures Procedures    Medications Ordered in ED Medications  insulin regular, human (MYXREDLIN) 100 units/ 100 mL infusion (5.5 Units/hr Intravenous Rate/Dose Change 07/03/22 2108)  dextrose 5 % in lactated ringers infusion (has no administration in time range)  dextrose 50 % solution 0-50 mL (has no administration in time range)  lactated ringers infusion ( Intravenous New Bag/Given 07/03/22 2028)  lactated ringers bolus 1,000 mL (0 mLs Intravenous Stopped 07/03/22 1953)  lactated ringers bolus 1,000 mL (0 mLs Intravenous Stopped 07/03/22 1953)    ED Course/ Medical Decision Making/ A&P    Patient seen and examined. History obtained directly from patient. Work-up including labs, imaging, EKG ordered in triage, if performed, were reviewed.    Labs/EKG: Independently reviewed and interpreted.  This included: CBG greater than 600.  Ordered CBC, CMP, lipase, beta hydroxybutyrate, venous blood gas, hemoglobin A1c.  Imaging: None ordered  Medications/Fluids: Lactated Ringer's 2000cc bolus.  Most recent vital signs reviewed and are as follows: BP 100/85 (BP Location: Left Arm)   Pulse (!) 132   Temp 98.2 F (36.8 C)   Resp 16   Ht 5' 9"  (1.753 m)   Wt 59.4 kg   SpO2 95%   BMI 19.35 kg/m   Initial impression: Hyperglycemia, possible DKA in setting of pancreatic cancer, recent chemotherapy and ongoing  steroid use.  7:51 PM Reassessment performed. Patient appears stable.  Hydration ongoing, nearly 1500 cc completed in the 2000 bolus.  Labs personally reviewed and interpreted including: CBC with normal white blood cell count, normal hemoglobin, normal platelet count; CMP with glucose elevated to 746 with acute kidney injury with creatinine 2.12 and BUN of 68, lipase 296; calcium 11.5, protein 8.4, sodium 146; venous blood gas with pH 7.35, bicarbonate 23.2.   Reviewed pertinent lab work and imaging with patient and wife at bedside. Questions answered.  Discussed need for admission to the hospital.  They would prefer to be admitted to [facility] where his cancer care is located if possible.  Patient discussed with Dr. Billy Fischer.   Most current vital signs reviewed and are as follows: BP (!) 136/97   Pulse (!) 106   Temp 98.2 F (36.8 C)   Resp (!) 27   Ht 5' 9"  (1.753 m)   Wt 59.4 kg   SpO2 96%   BMI 19.35 kg/m   Plan: Continue IV hydration, insulin.   9:41 PM Reassessment performed. Patient appears stable.  Labs personally reviewed and interpreted including: Blood sugars now improved to 464 with ongoing treatment.  I have consulted with oncology at [facility], Dr. Dennison Nancy.  Discussed current treatment regimen, plan.  She is in agreement with what we are doing.  At this point, do not feel that patient needs to be transferred to [facility] for ongoing treatment.  She will pass a message along to the patient's oncologist team as he will likely need adjustment to his current chemotherapy.  Patient was updated and agrees to be admitted here.  Most current vital signs reviewed and are as follows: BP 116/86   Pulse (!) 121   Temp 99 F (37.2 C) (Oral)   Resp (!) 28   Ht 5' 9"  (1.753 m)   Wt 59.4 kg   SpO2 95%   BMI 19.35 kg/m   Plan: Consult with hospitalist in regards to admission.  9:55 PM Consulted with Dr. Myna Hidalgo  of [facility] who accepts patient for admission.    CRITICAL  CARE Performed by: Carlisle Cater PA-C Total critical care time: 50 minutes Critical care time was exclusive of separately billable procedures and treating other patients. Critical care was necessary to treat or prevent imminent or life-threatening deterioration. Critical care was time spent personally by me on the following activities: development of treatment plan with patient and/or surrogate as well as nursing, discussions with consultants, evaluation of patient's response to treatment, examination of patient, obtaining history from patient or surrogate, ordering and performing treatments and interventions, ordering and review of laboratory studies, ordering and review of radiographic studies, pulse oximetry and re-evaluation of patient's condition.                           Medical Decision Making Amount and/or Complexity of Data Reviewed Labs: ordered.  Risk Prescription drug management.   Patient with hyperglycemia and acute kidney injury status post chemotherapy and likely instigated by use of dexamethasone.  Patient does not have a history of diabetes.  He has general poor oral intake which has been chronic.  Low concern for acute infection.  Overall exam is not overly suggestive of acute pancreatitis as he is not having upper abdominal pain, back pain or vomiting at this point.  DKA considered however venous pH is normal, only 15 ketones in urine.  Uremia and AKI likely contributing to elevated anion gap.        Final Clinical Impression(s) / ED Diagnoses Final diagnoses:  Steroid-induced hyperglycemia  Acute kidney injury ([place])  Elevated lipase    Rx / DC Orders ED Discharge Orders     None         Carlisle Cater, PA-C 07/03/22 2155    Gareth Morgan, MD 07/05/22 [phone]

## 2022-07-03 NOTE — ED Triage Notes (Signed)
Pt here form home with c/o weakness and poor appetite  some nausea but no vomiting ,

## 2022-07-03 NOTE — ED Notes (Signed)
[facility] arrived to transport pt. Pt stable at time of departure ?

## 2022-07-03 NOTE — Progress Notes (Signed)
RT note: Venous Blood Gas obtained/resulted on ISTAT, RN aware.

## 2022-07-04 ENCOUNTER — Encounter (HOSPITAL_COMMUNITY): Payer: Self-pay | Admitting: Family Medicine

## 2022-07-04 ENCOUNTER — Inpatient Hospital Stay (HOSPITAL_COMMUNITY): Payer: Medicare Other

## 2022-07-04 DIAGNOSIS — K219 Gastro-esophageal reflux disease without esophagitis: Secondary | ICD-10-CM

## 2022-07-04 DIAGNOSIS — N179 Acute kidney failure, unspecified: Secondary | ICD-10-CM

## 2022-07-04 DIAGNOSIS — E860 Dehydration: Secondary | ICD-10-CM

## 2022-07-04 DIAGNOSIS — E870 Hyperosmolality and hypernatremia: Secondary | ICD-10-CM

## 2022-07-04 DIAGNOSIS — R531 Weakness: Secondary | ICD-10-CM

## 2022-07-04 DIAGNOSIS — K2100 Gastro-esophageal reflux disease with esophagitis, without bleeding: Secondary | ICD-10-CM

## 2022-07-04 DIAGNOSIS — R7989 Other specified abnormal findings of blood chemistry: Secondary | ICD-10-CM

## 2022-07-04 DIAGNOSIS — E0810 Diabetes mellitus due to underlying condition with ketoacidosis without coma: Secondary | ICD-10-CM

## 2022-07-04 DIAGNOSIS — E1110 Type 2 diabetes mellitus with ketoacidosis without coma: Principal | ICD-10-CM

## 2022-07-04 LAB — GLUCOSE, CAPILLARY
Glucose-Capillary: 146 mg/dL — ABNORMAL HIGH (ref 70–99)
Glucose-Capillary: 172 mg/dL — ABNORMAL HIGH (ref 70–99)
Glucose-Capillary: 180 mg/dL — ABNORMAL HIGH (ref 70–99)
Glucose-Capillary: 184 mg/dL — ABNORMAL HIGH (ref 70–99)
Glucose-Capillary: 185 mg/dL — ABNORMAL HIGH (ref 70–99)
Glucose-Capillary: 186 mg/dL — ABNORMAL HIGH (ref 70–99)
Glucose-Capillary: 187 mg/dL — ABNORMAL HIGH (ref 70–99)
Glucose-Capillary: 189 mg/dL — ABNORMAL HIGH (ref 70–99)
Glucose-Capillary: 193 mg/dL — ABNORMAL HIGH (ref 70–99)
Glucose-Capillary: 204 mg/dL — ABNORMAL HIGH (ref 70–99)
Glucose-Capillary: 215 mg/dL — ABNORMAL HIGH (ref 70–99)
Glucose-Capillary: 217 mg/dL — ABNORMAL HIGH (ref 70–99)
Glucose-Capillary: 228 mg/dL — ABNORMAL HIGH (ref 70–99)
Glucose-Capillary: 279 mg/dL — ABNORMAL HIGH (ref 70–99)
Glucose-Capillary: 302 mg/dL — ABNORMAL HIGH (ref 70–99)
Glucose-Capillary: 312 mg/dL — ABNORMAL HIGH (ref 70–99)
Glucose-Capillary: 313 mg/dL — ABNORMAL HIGH (ref 70–99)

## 2022-07-04 LAB — COMPREHENSIVE METABOLIC PANEL
ALT: 39 U/L (ref 0–44)
AST: 20 U/L (ref 15–41)
Albumin: 2.7 g/dL — ABNORMAL LOW (ref 3.5–5.0)
Alkaline Phosphatase: 97 U/L (ref 38–126)
Anion gap: 9 (ref 5–15)
BUN: 51 mg/dL — ABNORMAL HIGH (ref 8–23)
CO2: 27 mmol/L (ref 22–32)
Calcium: 9.9 mg/dL (ref 8.9–10.3)
Chloride: 116 mmol/L — ABNORMAL HIGH (ref 98–111)
Creatinine, Ser: 1.33 mg/dL — ABNORMAL HIGH (ref 0.61–1.24)
GFR, Estimated: 59 mL/min — ABNORMAL LOW (ref >60)
Glucose, Bld: 253 mg/dL — ABNORMAL HIGH (ref 70–99)
Potassium: 3.8 mmol/L (ref 3.5–5.1)
Sodium: 152 mmol/L — ABNORMAL HIGH (ref 135–145)
Total Bilirubin: 0.3 mg/dL (ref 0.3–1.2)
Total Protein: 7.1 g/dL (ref 6.5–8.1)

## 2022-07-04 LAB — CBC WITH DIFFERENTIAL/PLATELET
Abs Immature Granulocytes: 0.06 10*3/uL (ref 0.00–0.07)
Basophils Absolute: 0 10*3/uL (ref 0.0–0.1)
Basophils Relative: 0 %
Eosinophils Absolute: 0 10*3/uL (ref 0.0–0.5)
Eosinophils Relative: 0 %
HCT: 36.6 % — ABNORMAL LOW (ref 39.0–52.0)
Hemoglobin: 12 g/dL — ABNORMAL LOW (ref 13.0–17.0)
Immature Granulocytes: 1 %
Lymphocytes Relative: 13 %
Lymphs Abs: 0.8 10*3/uL (ref 0.7–4.0)
MCH: 29 pg (ref 26.0–34.0)
MCHC: 32.8 g/dL (ref 30.0–36.0)
MCV: 88.4 fL (ref 80.0–100.0)
Monocytes Absolute: 0.2 10*3/uL (ref 0.1–1.0)
Monocytes Relative: 3 %
Neutro Abs: 5 10*3/uL (ref 1.7–7.7)
Neutrophils Relative %: 83 %
Platelets: 209 10*3/uL (ref 150–400)
RBC: 4.14 MIL/uL — ABNORMAL LOW (ref 4.22–5.81)
RDW: 12.7 % (ref 11.5–15.5)
WBC: 6 10*3/uL (ref 4.0–10.5)
nRBC: 0 % (ref 0.0–0.2)

## 2022-07-04 LAB — BASIC METABOLIC PANEL
Anion gap: 6 (ref 5–15)
Anion gap: 7 (ref 5–15)
BUN: 49 mg/dL — ABNORMAL HIGH (ref 8–23)
BUN: 43 mg/dL — ABNORMAL HIGH (ref 8–23)
CO2: 29 mmol/L (ref 22–32)
CO2: 26 mmol/L (ref 22–32)
Calcium: 9.8 mg/dL (ref 8.9–10.3)
Calcium: 9.1 mg/dL (ref 8.9–10.3)
Chloride: 122 mmol/L — ABNORMAL HIGH (ref 98–111)
Chloride: 116 mmol/L — ABNORMAL HIGH (ref 98–111)
Creatinine, Ser: 1.22 mg/dL (ref 0.61–1.24)
Creatinine, Ser: 1.06 mg/dL (ref 0.61–1.24)
GFR, Estimated: >60 mL/min (ref >60)
GFR, Estimated: >60 mL/min (ref >60)
Glucose, Bld: 167 mg/dL — ABNORMAL HIGH (ref 70–99)
Glucose, Bld: 303 mg/dL — ABNORMAL HIGH (ref 70–99)
Potassium: 3.6 mmol/L (ref 3.5–5.1)
Potassium: 4.2 mmol/L (ref 3.5–5.1)
Sodium: 157 mmol/L — ABNORMAL HIGH (ref 135–145)
Sodium: 149 mmol/L — ABNORMAL HIGH (ref 135–145)

## 2022-07-04 LAB — PHOSPHORUS: Phosphorus: 1.2 mg/dL — ABNORMAL LOW (ref 2.5–4.6)

## 2022-07-04 LAB — SODIUM, URINE, RANDOM: Sodium, Ur: 56 mmol/L

## 2022-07-04 LAB — MRSA NEXT GEN BY PCR, NASAL: MRSA by PCR Next Gen: NOT DETECTED

## 2022-07-04 LAB — TSH: TSH: 1.435 u[IU]/mL (ref 0.350–4.500)

## 2022-07-04 LAB — CREATININE, URINE, RANDOM: Creatinine, Urine: 108 mg/dL

## 2022-07-04 LAB — OSMOLALITY: Osmolality: 343 mOsm/kg (ref 275–295)

## 2022-07-04 LAB — MAGNESIUM
Magnesium: 2.8 mg/dL — ABNORMAL HIGH (ref 1.7–2.4)
Magnesium: 2.3 mg/dL (ref 1.7–2.4)

## 2022-07-04 MED ORDER — HYDROCODONE-ACETAMINOPHEN 5-325 MG PO TABS: 1.0000 | ORAL_TABLET | ORAL | Status: DC | PRN

## 2022-07-04 MED ORDER — BOOST / RESOURCE BREEZE PO LIQD CUSTOM
1.0000 | Freq: Three times a day (TID) | ORAL | Status: DC
  Administered 2022-07-04 – 2022-07-05 (×4): 1 via ORAL

## 2022-07-04 MED ORDER — INSULIN ASPART 100 UNIT/ML IJ SOLN
0.0000 [IU] | INTRAMUSCULAR | Status: DC
  Administered 2022-07-04: 7 [IU] via SUBCUTANEOUS
  Administered 2022-07-04: 5 [IU] via SUBCUTANEOUS
  Administered 2022-07-04: 3 [IU] via SUBCUTANEOUS
  Administered 2022-07-05: 1 [IU] via SUBCUTANEOUS
  Administered 2022-07-05: 5 [IU] via SUBCUTANEOUS
  Administered 2022-07-05: 3 [IU] via SUBCUTANEOUS
  Administered 2022-07-05: 9 [IU] via SUBCUTANEOUS
  Administered 2022-07-05: 3 [IU] via SUBCUTANEOUS

## 2022-07-04 MED ORDER — ALTEPLASE 2 MG IJ SOLR
INTRAMUSCULAR | Status: AC
  Administered 2022-07-04: 2 mg
  Filled 2022-07-04: qty 2

## 2022-07-04 MED ORDER — ALTEPLASE 2 MG IJ SOLR: 2.0000 mg | Freq: Once | INTRAMUSCULAR | Status: DC

## 2022-07-04 MED ORDER — ACETAMINOPHEN 650 MG RE SUPP: 650.0000 mg | Freq: Four times a day (QID) | RECTAL | Status: DC | PRN

## 2022-07-04 MED ORDER — ACETAMINOPHEN 325 MG PO TABS: 650.0000 mg | ORAL_TABLET | Freq: Four times a day (QID) | ORAL | Status: DC | PRN

## 2022-07-04 MED ORDER — INSULIN GLARGINE-YFGN 100 UNIT/ML ~~LOC~~ SOLN
10.0000 [IU] | Freq: Every day | SUBCUTANEOUS | Status: DC
Start: 2022-07-04 — End: 2022-07-04
  Administered 2022-07-04: 10 [IU] via SUBCUTANEOUS
  Filled 2022-07-04: qty 0.1

## 2022-07-04 MED ORDER — KCL IN DEXTROSE-NACL 10-5-0.45 MEQ/L-%-% IV SOLN
INTRAVENOUS | Status: DC
  Filled 2022-07-04 (×2): qty 1000

## 2022-07-04 MED ORDER — INSULIN GLARGINE-YFGN 100 UNIT/ML ~~LOC~~ SOLN
10.0000 [IU] | Freq: Once | SUBCUTANEOUS | Status: AC
  Administered 2022-07-04: 10 [IU] via SUBCUTANEOUS
  Filled 2022-07-04: qty 0.1

## 2022-07-04 MED ORDER — FREE WATER: 200.0000 mL | Status: DC

## 2022-07-04 MED ORDER — POTASSIUM PHOSPHATES 15 MMOLE/5ML IV SOLN
30.0000 mmol | Freq: Once | INTRAVENOUS | Status: AC
  Administered 2022-07-04: 30 mmol via INTRAVENOUS
  Filled 2022-07-04: qty 10

## 2022-07-04 MED ORDER — INSULIN ASPART 100 UNIT/ML IJ SOLN: 0.0000 [IU] | Freq: Every day | INTRAMUSCULAR | Status: DC

## 2022-07-04 MED ORDER — DEXTROSE 5 % IV SOLN: INTRAVENOUS | Status: DC

## 2022-07-04 MED ORDER — KCL-LACTATED RINGERS-D5W 20 MEQ/L IV SOLN
INTRAVENOUS | Status: DC
  Filled 2022-07-04: qty 1000

## 2022-07-04 MED ORDER — LIVING WELL WITH DIABETES BOOK
Freq: Once | Status: AC
  Administered 2022-07-04: 1
  Filled 2022-07-04: qty 1

## 2022-07-04 MED ORDER — INSULIN GLARGINE-YFGN 100 UNIT/ML ~~LOC~~ SOLN
20.0000 [IU] | Freq: Every day | SUBCUTANEOUS | Status: DC
  Administered 2022-07-05: 20 [IU] via SUBCUTANEOUS
  Filled 2022-07-04 (×2): qty 0.2

## 2022-07-04 MED ORDER — SODIUM CHLORIDE 0.45 % IV SOLN
INTRAVENOUS | Status: DC
  Filled 2022-07-04: qty 1000

## 2022-07-04 MED ORDER — STERILE WATER FOR INJECTION IJ SOLN
INTRAMUSCULAR | Status: AC
  Filled 2022-07-04: qty 10

## 2022-07-04 MED ORDER — FAMOTIDINE 20 MG PO TABS
20.0000 mg | ORAL_TABLET | Freq: Every day | ORAL | Status: DC
  Administered 2022-07-04 – 2022-07-05 (×2): 20 mg via ORAL
  Filled 2022-07-04 (×2): qty 1

## 2022-07-04 MED ORDER — INSULIN STARTER KIT- PEN NEEDLES (ENGLISH)
1.0000 | Freq: Once | Status: AC
  Administered 2022-07-04: 1
  Filled 2022-07-04: qty 1

## 2022-07-04 MED ORDER — INSULIN ASPART 100 UNIT/ML IJ SOLN: 0.0000 [IU] | Freq: Three times a day (TID) | INTRAMUSCULAR | Status: DC

## 2022-07-04 NOTE — H&P (Signed)
History and Physical    PLEASE NOTE THAT DRAGON DICTATION SOFTWARE WAS USED IN THE CONSTRUCTION OF THIS NOTE.   Micheal Guerrero PQD:[id number] DOB: 08-25-1956 DOA: 07/03/2022  PCP: Biagio Borg, MD  Patient coming from: home   I have personally briefly reviewed patient's old medical records in [place]  Chief Complaint: Generalized weakness  HPI: Micheal Guerrero is a 66 y.o. male with medical history significant for pancreatic cancer status post Whipple procedure in 2020, undergoing chemotherapy, with mets to peritoneum, who is admitted to [facility] on 07/03/2022 by way of transfer from [facility] emergency department with DKA after presenting from home to the latter facility complaining of generalized weakness.   The patient reports 4 to 5 days of generalized weakness In the absence of any associated acute focal weakness, acute focal numbness, paresthesias, facial droop, slurred speech, expressive aphasia, acute change in vision, dysphagia, vertigo.  He notes that this has been associated with new onset polydipsia/polyuria.  Recent decrease in oral intake as a consequence of diminished appetite.  He notes intermittent nausea in the absence of any vomiting.  No new onset abdominal pain, diarrhea, melena, hematochezia.  No recent chest pain, shortness of breath, cough, palpitations, diaphoresis.  Denies any recent dysuria, rash, neck stiffness.   In the context of his history of pancreatic cancer status post Whipple procedure in 2020 with peritoneal metastasis, he is followed by [facility] oncology, currently undergoing chemotherapy.  He has reportedly been receiving IV steroids with his chemotherapy as well as oral steroids.  He denies any known history of underlying diabetes, and conveys that he is not on any insulin as an outpatient.     [facility] ED Course:  Vital signs in the ED were notable for the following: Afebrile; heart rate 107 121; initial blood  pressure 100/85, with subsequent improvement to 138/93 following interval IV fluids, as further detailed below; respiratory rate 25-27, oxygen saturation 96 to 100% on room air.  Labs were notable for the following: CMP notable for sodium 146 potassium 4.6, bicarbonate 21, anion gap 24, creatinine 2.12 relative to most recent prior serum creatinine 0 point of 0.86 on 05/15/2022, glucose 746.  Beta hydroxybutyric acid 3.66.  CBC notable for white cell count 8300.  Urinalysis notable for no white blood cells, 15 ketones, 30 protein and 21-50 white blood cells.  Imaging and additional notable ED work-up: Chest x-ray showed no evidence of acute cardiopulmonary process.  While in the ED, the following were administered: Insulin drip, lactated Ringer's x2 intervals followed by continuous LR at 125 cc/h.  [facility] EDP discussed patient's case with [facility] oncology, who confirmed no negation for inpatient oncologic services at this time.  Additionally, [facility] confirmed that they currently have no medical beds available for admission for DKA.  Subsequently, the patient was admitted to [facility] for further evaluation management to DKA in the absence of any prior diagnosis of diabetes, associated with hyponatremia, generalized weakness, hypercalcemia, acute kidney injury.    Review of Systems: As per HPI otherwise 10 point review of systems negative.   Past Medical History:  Diagnosis Date   Cancer of head of pancreas ([place]) 08/12/2019   DIVERTICULOSIS, COLON 08/19/2009   HYPERLIPIDEMIA 08/19/2009   LEG CRAMPS 08/19/2009    Past Surgical History:  Procedure Laterality Date   COLOSTOMY     s/p bakers cyst knee      @ age 85   VASECTOMY      Social History:  reports that he has quit smoking. He has never used smokeless tobacco. He reports current alcohol use. He reports current drug use.   No Known Allergies  Family History  Problem Relation Age of Onset   Dementia Father     Family  history reviewed and not pertinent    Prior to Admission medications   Medication Sig Start Date End Date Taking? Authorizing Provider  acetaminophen (TYLENOL) 500 MG tablet Take by mouth.    [provider]  dexamethasone (DECADRON) 4 MG tablet Take by mouth. Patient not taking: Reported on 11/30/2021 05/10/21   [provider]  famotidine (PEPCID) 20 MG tablet Take by mouth.    [provider]  HYDROcodone-acetaminophen (NORCO/VICODIN) 5-325 MG tablet Take 1 tablet by mouth every 4 (four) hours as needed. 05/15/22   Isla Pence, MD  methocarbamol (ROBAXIN) 500 MG tablet Take 1 tablet (500 mg total) by mouth 2 (two) times daily. 05/15/22   Isla Pence, MD  ondansetron (ZOFRAN-ODT) 8 MG disintegrating tablet Take by mouth. Patient not taking: Reported on 11/30/2021 05/10/21   [provider]  prochlorperazine (COMPAZINE) 10 MG tablet Take by mouth. Patient not taking: Reported on 11/30/2021 04/18/21   [provider]  senna (SENOKOT) 8.6 MG tablet Take by mouth. Patient not taking: Reported on 11/30/2021 05/10/21   [provider]  Sod Picosulfate-Mag Ox-Cit Acd (CLENPIQ) 10-3.5-12 MG-GM -GM/160ML SOLN Take 1 kit by mouth as directed. Patient not taking: Reported on 11/30/2021 03/30/21   Cirigliano, Luanna Salk V, DO  tamsulosin (FLOMAX) 0.4 MG CAPS capsule Take 0.4 mg by mouth daily. Patient not taking: Reported on 11/30/2021 11/01/21   [provider]  zolpidem (AMBIEN) 5 MG tablet Take 1 tablet (5 mg total) by mouth at bedtime as needed for sleep. 05/15/22 06/14/22  Isla Pence, MD     Objective    Physical Exam: Vitals:   07/04/22 0100 07/04/22 0200 07/04/22 0300 07/04/22 0400  BP:      Pulse: (!) 118 (!) 119 (!) 130 (!) 111  Resp: (!) 27 (!) 26 18 19   Temp:    98.3 F (36.8 C)  TempSrc:    Oral  SpO2: 96% 96% 98% 97%  Weight:      Height:        General: appears to be stated age; alert, oriented Skin: warm, dry,  no rash Head:  AT/[place] Mouth:  Oral mucosa membranes appear dry, normal dentition Neck: supple; trachea midline Chest: Port-A-Cath noted on right Heart:  RRR; did not appreciate any M/R/G Lungs: CTAB, did not appreciate any wheezes, rales, or rhonchi Abdomen: + BS; soft, ND, NT Vascular: 2+ pedal pulses b/l; 2+ radial pulses b/l Extremities: no peripheral edema, no muscle wasting Neuro: strength and sensation intact in upper and lower extremities b/l     Labs on Admission: I have personally reviewed following labs and imaging studies  CBC: Recent Labs  Lab 07/03/22 1814 07/03/22 1820 07/04/22 0143  WBC 8.3  --  6.0  NEUTROABS 7.0  --  5.0  HGB 14.4 15.0 12.0*  HCT 45.0 44.0 36.6*  MCV 88.6  --  88.4  PLT 340  --  631   Basic Metabolic Panel: Recent Labs  Lab 07/03/22 1814 07/03/22 1820 07/03/22 2020 07/04/22 0143  NA 146* 145 148* 152*  K 4.6 4.5 4.5 3.8  CL 101  --  108 116*  CO2 21*  --  21* 27  GLUCOSE 746*  --  596* 253*  BUN  68*  --  61* 51*  CREATININE 2.12*  --  1.53* 1.33*  CALCIUM 11.5*  --  10.5* 9.9  MG  --   --   --  2.8*   GFR: Estimated Creatinine Clearance: 46.9 mL/min (A) (by C-G formula based on SCr of 1.33 mg/dL (H)). Liver Function Tests: Recent Labs  Lab 07/03/22 1814 07/04/22 0143  AST 14* 20  ALT 46* 39  ALKPHOS 123 97  BILITOT 0.5 0.3  PROT 8.4* 7.1  ALBUMIN 4.1 2.7*   Recent Labs  Lab 07/03/22 1814  LIPASE 296*   No results for input(s): "AMMONIA" in the last 168 hours. Coagulation Profile: No results for input(s): "INR", "PROTIME" in the last 168 hours. Cardiac Enzymes: No results for input(s): "CKTOTAL", "CKMB", "CKMBINDEX", "TROPONINI" in the last 168 hours. BNP (last 3 results) No results for input(s): "PROBNP" in the last 8760 hours. HbA1C: Recent Labs    07/03/22 1819  HGBA1C 8.5*   CBG: Recent Labs  Lab 07/04/22 0120 07/04/22 0222 07/04/22 0316 07/04/22 0423 07/04/22 0524  GLUCAP 312* 217* 187* 204*  193*   Lipid Profile: No results for input(s): "CHOL", "HDL", "LDLCALC", "TRIG", "CHOLHDL", "LDLDIRECT" in the last 72 hours. Thyroid Function Tests: No results for input(s): "TSH", "T4TOTAL", "FREET4", "T3FREE", "THYROIDAB" in the last 72 hours. Anemia Panel: No results for input(s): "VITAMINB12", "FOLATE", "FERRITIN", "TIBC", "IRON", "RETICCTPCT" in the last 72 hours. Urine analysis:    Component Value Date/Time   COLORURINE YELLOW 07/03/2022 2020   APPEARANCEUR HAZY (A) 07/03/2022 2020   LABSPEC 1.027 07/03/2022 2020   PHURINE 5.0 07/03/2022 2020   GLUCOSEU >1,000 (A) 07/03/2022 2020   GLUCOSEU NEGATIVE 02/28/2021 1559   HGBUR LARGE (A) 07/03/2022 2020   BILIRUBINUR NEGATIVE 07/03/2022 2020   KETONESUR 15 (A) 07/03/2022 2020   PROTEINUR 30 (A) 07/03/2022 2020   UROBILINOGEN 0.2 02/28/2021 1559   NITRITE NEGATIVE 07/03/2022 2020   LEUKOCYTESUR NEGATIVE 07/03/2022 2020    Radiological Exams on Admission: DG CHEST PORT 1 VIEW  Result Date: 07/04/2022 CLINICAL DATA:  Port-A-Cath placement. EXAM: PORTABLE CHEST 1 VIEW COMPARISON:  07/08/2019. FINDINGS: The heart size and mediastinal contours are within normal limits. Lung volumes are low. No consolidation, effusion, or pneumothorax. A right chest port terminates over the superior vena cava. No acute osseous abnormality. IMPRESSION: A right chest port terminates over the superior vena cava. No acute abnormality is identified. Electronically Signed   By: Brett Fairy M.D.   On: 07/04/2022 00:41      Assessment/Plan    Principal Problem:   DKA (diabetic ketoacidosis) ([place]) Active Problems:   Dehydration   Hypernatremia   Generalized weakness   Hypercalcemia   AKI (acute kidney injury) ([place])   GERD (gastroesophageal reflux disease)       #) Diabetic ketoacidosis: In the setting of no known history of underlying diabetes, the patient presents with 4 to 5 days of new onset polyuria/polydipsia in the setting of generalized  weakness, with initial blood sugar noted to be 746, with additional labs notable for progression We will, anion gap 24, elevated beta hydroxybutyric acid, urinary ketones.   presenting labs notable for serum potassium of 4.6.  In the context of no known history of underlying diabetes, suspect hyperglycemic contributions from recent systemic corticosteroid initiation as component oncologic management of his pancreatic cancer, as above.  Additionally, in the setting of pancreatic cancer, there may also be a primary development diabetes.  We will check hemoglobin A1c to further assess.  No  overt evidence of underlying factious process at this time.   In the ED, insulin drip was initiated, and the following IVF's administered: 2 L LR bolus, followed by continuous LR at 125 cc/h. In setting of most recent potassium level of  4.6, will transition IVF's to 1/2NS with 10 mEq/L Kcl @ 125 cc/hr, w/ plan to add D5 to existing IV fluids when CBG reflects blood sugar less than 250.    Plan: DKA protocol initiated. Insulin drip per [facility] dka protocol. Q1H cbg's. Q4H BMP's. NPO for now until ensuing improvement in degree of presenting hyperglycemic as well as ability to tolerate PO. IVF's, as above. Once AG has closed and patient tolerating PO, will initiate sq basal insulin while continuing insulin drip for an additional two hours to prevent rebound hyperglycemia, before ultimately turning off the insulin drip. Monitor on telemetry. Monitor strict I's & O's.  An inpatient consult to diabetic educator has been placed.  Check A1c.         #) Acute Kidney Injury: presenting creatinine 2.12 relative to most recent prior value 0.86 in June 2023. Appears to be prerenal in nature stemming from dehydration as a consequence of hyperglyemic-associated auto-diuresis, as above. UA demonstrates 30 protein as well as 21-50 RBCs in the absence of any white blood cell casts.  Additional labs notable for confirmation of acute  prerenal azotemia.  Plan: monitor strict I's & O's and daily weights. Attempt to avoid nephrotoxic agents. IVF's, as above.  Follow renal trend via every 4 hours BMPs, as above .  Check serum mag level.          #) Generalized weakness:  4-5  duration of generalized weakness in the absence of any acute focal neurologic deficits to suggest acute CVA.  Likely multifactorial in nature, with contributions from hyperglycemia ultimately leading to DKA presentation,  as well as resultant dehydration, as above, all superimposed on physiologic stress stemming from the patient's known metastatic pancreatic cancer undergoing chemotherapy.  No evidence of underlying infectious process at this time, as further detailed above.    Plan: Further evaluation management of presenting hyperglycemia, including plan for IVF's, as further detailed above.  Fall precautions.  Check TSH.  PT/OT consult placed for the morning. Check  A1c, as above.        #) Hypercalcemia: Presenting not ingested serum calcium level noted to be 11.5.  Suspect significant contribution from dehydration, as above.  However, hypercalcemia of malignancy is a possibility.  We will trend serum calcium level in response to IV fluid resuscitative efforts, with plan to expand work-up if no significant improvement in serum calcium level with IV fluids.  Plan: IV fluids, as above.  Repeat CMP in the morning.  Add on serum magnesium and phosphorus levels.       #) Hypernatremia: Without correction for concomitant hyperglycemia, presenting serum sodium level noted to be 146.  Unclear chronicity of this elevated sodium level, warranting additional chart review for determination of such.  Suspect an element of free water deficit in the setting of dehydration.  Will provide IV fluid resuscitative efforts and follow ensuing trend serum sodium for determination of need for ultimate conversion to D5 water to correct remaining free water deficit,  dependent upon results of ensuing hypernatremia evaluation, as further detailed below.  Plan: Add on random urine sodium, random urine osmolality, serum osmolality, TSH.  Monitor strict I's and O's and daily weights.  Continue existing IV fluids.  Monitor serum sodium trend via every  4 hours BMPs.         #) GERD: documented h/o such; on   pepcid as outpatient.   Plan: continue home H2 blocker.        DVT prophylaxis: SCD's   Code Status: Full code Disposition Plan: Per Rounding Team Consults called: none;  Admission status: Inpatient   PLEASE NOTE THAT DRAGON DICTATION SOFTWARE WAS USED IN THE CONSTRUCTION OF THIS NOTE.   Victoria DO [facility] [place]   07/04/2022, 5:33 AM

## 2022-07-04 NOTE — Progress Notes (Signed)
PT Cancellation Note  Patient Details Name: Micheal Guerrero MRN: [id number] DOB: [birth year]   Cancelled Treatment:    Reason Eval/Treat Not Completed: Patient declined, no reason specified. Pt not feeling up to PT today, requests we come back tomorrow or as schedule allows.  Baxter Flattery, PT  Acute Rehab Dept [facility]) [phone]  [facility] Weekend Pager [facility] only)  [phone]  07/04/2022    [facility] 07/04/2022, 2:40 PM

## 2022-07-04 NOTE — Evaluation (Signed)
Occupational Therapy Evaluation Patient Details Name: Micheal Guerrero MRN: [id number] DOB: [birth year] Today's Date: 07/04/2022   History of Present Illness Micheal Guerrero is a 66 y.o. male with medical history significant for pancreatic cancer status post Whipple procedure in 2020, undergoing chemotherapy, with mets to peritoneum, who is admitted to [facility] on 07/03/2022 by way of transfer from [facility] emergency department with DKA after presenting from home to the latter facility complaining of generalized weakness.   Clinical Impression   Patient is currently requiring assistance with ADLs including min guard to minimal assist with Lower body ADLs, setup assist with Upper body ADLs,  as well as  supervision with bed mobility and Min guard to Min assist with functional transfers to toilet.  Current level of function is below patient's typical baseline of [place] and working as an Forensic psychologist.  During this evaluation, patient was limited by generalized weakness, impaired activity tolerance, and dizziness with standing/marching <30 seconds with BP and HR changes as below, all of which has the potential to impact patient's safety and independence during functional mobility, as well as performance for ADLs.  Patient lives in a 4 story house, with his wife who is able to provide PRN supervision and assistance as she also works during the day.  Patient demonstrates good rehab potential, and should benefit from continued skilled occupational therapy services while in acute care to maximize safety, independence and quality of life at home.  ?       Recommendations for follow up therapy are one component of a multi-disciplinary discharge planning process, led by the attending physician.  Recommendations may be updated based on patient status, additional functional criteria and insurance authorization.   Follow Up Recommendations  No OT follow up (Pending progress)    Assistance  Recommended at Discharge PRN  Patient can return home with the following A little help with walking and/or transfers;A little help with bathing/dressing/bathroom    Functional Status Assessment  Patient has had a recent decline in their functional status and demonstrates the ability to make significant improvements in function in a reasonable and predictable amount of time.  Equipment Recommendations       Recommendations for Other Services       Precautions / Restrictions Precautions Precautions: Fall Precaution Comments: Colostomy-high gait belt Restrictions Weight Bearing Restrictions: No      Mobility Bed Mobility Overal bed mobility: Needs Assistance Bed Mobility: Supine to Sit, Sit to Supine     Supine to sit: Supervision, HOB elevated Sit to supine: Supervision, HOB elevated   General bed mobility comments: Increased time/effort. Supervision for lines management/safety.    Transfers                          Balance Overall balance assessment: Mild deficits observed, not formally tested                                         ADL either performed or assessed with clinical judgement   ADL Overall ADL's : Needs assistance/impaired     Grooming: Sitting;Set up   Upper Body Bathing: Set up;Sitting   Lower Body Bathing: Min guard;Sitting/lateral leans;Sit to/from stand;Minimal assistance   Upper Body Dressing : Set up;Sitting   Lower Body Dressing: Min guard;Minimal assistance Lower Body Dressing Details (indicate cue type and reason): Able to demonstrate figure 4  for each LE at EOB and doff/don each sock. Standing LE dressing Min Guard to [employer]. Toilet Transfer: Minimal Print production planner Details (indicate cue type and reason): Pt stood from EOB with close supervision. Pt worked on Warden/ranger in place with one LOB and Min As to correct. No AD used. Pt also reporting symptomatic dizziness and BP taken in standing. Pt with  BP drop from 128/89 to 109/78 eith increased HR to 138, recovering to 118 once back in supine. Toileting- Water quality scientist and Hygiene: Min guard       Functional mobility during ADLs: Min guard;Minimal assistance       Vision Baseline Vision/History: 1 Wears glasses Ability to See in Adequate Light: 0 Adequate Vision Assessment?: No apparent visual deficits     Perception     Praxis      Pertinent Vitals/Pain Pain Assessment Pain Assessment: No/denies pain     Hand Dominance Right   Extremity/Trunk Assessment Upper Extremity Assessment Upper Extremity Assessment: Overall WFL for tasks assessed   Lower Extremity Assessment Lower Extremity Assessment: Defer to PT evaluation   Cervical / Trunk Assessment Cervical / Trunk Assessment: Normal   Communication Communication Communication: HOH (weak voice)   Cognition Arousal/Alertness: Lethargic Behavior During Therapy: WFL for tasks assessed/performed Overall Cognitive Status: Impaired/Different from baseline Area of Impairment: Orientation                 Orientation Level: Disoriented to, Time (month)                   General Comments       Exercises     Shoulder Instructions      Home Living Family/patient expects to be discharged to:: Private residence Living Arrangements: Spouse/significant other Available Help at Discharge: Available PRN/intermittently (Spouse works days.) Type of Home: House Home Access: Stairs to enter Technical brewer of Steps: 6 Entrance Stairs-Rails: Right Home Layout: Multi-level;Full bath on main level;Other (Comment) (Has not setup main level for bedroom-no bed in there yet, but able to.) Alternate Level Stairs-Number of Steps: 4 story house, including basement.  Full flight to upstairs bed/bath with bilateral rails 1st half, then LT rail for 2nd half. Alternate Level Stairs-Rails: Left Bathroom Shower/Tub: Occupational psychologist: Standard      Home Equipment: Grab bars - tub/shower;Grab bars - toilet;Shower seat - built in          Prior Functioning/Environment Prior Level of Function : Independent/Modified Independent             Mobility Comments: 2 weeks ago pror to symptom onset, pt reports total independence with all ambulation and ADLs. ADLs Comments: Was walking 5 miles most days less than 2 weeks ago. No falls. Still working as a Education administrator.        OT Problem List: Decreased activity tolerance;Cardiopulmonary status limiting activity;Decreased strength;Decreased knowledge of use of DME or AE;Impaired balance (sitting and/or standing)      OT Treatment/Interventions: Self-care/ADL training;Therapeutic exercise;Therapeutic activities;Energy conservation;DME and/or AE instruction;Patient/family education;Balance training    OT Goals(Current goals can be found in the care plan section) Acute Rehab OT Goals Patient Stated Goal: Work on Mobility OT Goal Formulation: With patient Time For Goal Achievement: 07/18/22 Potential to Achieve Goals: Good ADL Goals Pt Will Perform Lower Body Dressing: Independently;sit to/from stand Pt Will Transfer to Toilet: with modified independence;ambulating (using LRAD) Pt Will Perform Toileting - Clothing Manipulation and hygiene: with modified independence;sitting/lateral leans;sit to/from stand Pt Will Perform  Tub/Shower Transfer: Therapist, sports supervision;ambulating;grab bars;shower seat Additional ADL Goal #1: Pt will engage in 20 min functional activities such as standing ADLs, gathering clothing in room, making bed, or therapeutic exercise without loss of sitting or standing balance, in order to demonstrate improved activity tolerance and balance needed to perform ADLs safely at home.  OT Frequency: Min 2X/week    Co-evaluation              AM-PAC OT "6 Clicks" Daily Activity     Outcome Measure Help from another person eating meals?:  None Help from another person taking care of personal grooming?: A Little Help from another person toileting, which includes using toliet, bedpan, or urinal?: A Little Help from another person bathing (including washing, rinsing, drying)?: A Little Help from another person to put on and taking off regular upper body clothing?: A Little Help from another person to put on and taking off regular lower body clothing?: A Little 6 Click Score: 19   End of Session Nurse Communication: Mobility status;Other (comment) (BP and HR changes)  Activity Tolerance: Patient limited by fatigue Patient left: in bed;with call bell/phone within reach;with bed alarm set  OT Visit Diagnosis: Unsteadiness on feet (R26.81)                Time: 8889-1694 OT Time Calculation (min): 26 min Charges:  OT General Charges $OT Visit: 1 Visit OT Evaluation $OT Eval Low Complexity: 1 Low OT Treatments $Therapeutic Activity: 8-22 mins  Anderson Malta, [place] Office: [phone] 07/04/2022  Julien Girt 07/04/2022, 11:37 AM

## 2022-07-04 NOTE — Progress Notes (Addendum)
Inpatient Diabetes Program Recommendations  AACE/ADA: New Consensus Statement on Inpatient Glycemic Control (2015)  Target Ranges:  Prepandial:   less than 140 mg/dL      Peak postprandial:   less than 180 mg/dL (1-2 hours)      Critically ill patients:  140 - 180 mg/dL   Lab Results  Component Value Date   GLUCAP 189 (H) 07/04/2022   HGBA1C 8.5 (H) 07/03/2022    Review of Glycemic Control  Latest Reference Range & Units 07/04/22 07:25 07/04/22 08:39 07/04/22 09:47 07/04/22 10:40  Glucose-Capillary 70 - 99 mg/dL 186 (H) 185 (H) 172 (H) 189 (H)   Diabetes history: DM-New diagnosis Outpatient Diabetes medications:  None Current orders for Inpatient glycemic control:  Semglee 10 units daily Novolog 0-9 units q 4 hours  Inpatient Diabetes Program Recommendations:    A1C=8.5% indicating average blood glucose of 195 mg/dL.  Possibly blood sugars increased from steroids, however also note diagnosis of pancreatic cancer.  Will see patient today and teach insulin pen just in case.  May also benefit from CGM for monitoring at home.   Thanks,  Adah Perl, RN, BC-ADM Inpatient Diabetes Coordinator Pager [phone]  (8a-5p)  Addendum [phone]- Spoke briefly with patient regarding possible need for insulin.  He is familiar with diabetes and how to check blood sugars.  Reviewed current A1C with him of 8.5%.  Will return on 8/2 to review insulin pen with him and how to check blood sugars.  Again may be good candidate for Freestyle CGM.  Will order LWWD booklet, insulin pen teaching kit, etc.  No family in room with him and he seems very tired. Will follow up with education on 8/2.

## 2022-07-04 NOTE — Progress Notes (Signed)
Patient was given 10 units Semglee at 1245. Insulin gtt and associated fluids were stopped at 1456 per orders. This RN will continue to carefully monitor patient's CBG.

## 2022-07-04 NOTE — Progress Notes (Addendum)
Care started prior to midnight in the emergency room and patient was admitted early this morning after midnight by Dr. Babs Bertin and I am in current agreement with his assessment and plan.  Additional changes to plan of care been made accordingly.  The patient is a chronically ill-appearing thin cachectic and frail Caucasian male with past medical history significant for but #2 pancreatic cancer status post Whipple procedure with mets to the peritoneum who was on consult currently undergoing chemotherapy at [facility] was admitted with generalized weakness and worked up further and was found to be in DKA.  Patient reported about 4 to 5 days of generalized weakness in the absence of any acute focal weakness and he also noted new onset polydipsia and polyuria.  He has had very poor p.o. intake and has lost over 40 pounds.  He has no history of diabetes mellitus type 2.  In the ED he was found to be in DKA and hemoglobin A1c was 8.5.  He was given IV fluid resuscitation and placed on insulin drip which is now been transitioned to subcu insulin.  He continues to be hyper Trimix so we will continue D5W and recommending free water flushes.  His AKI is improving and his gap is closed.  Currently being treated for DKA, AKI, generalized weakness, hypercalcemia, hyponatremia, GERD, abnormal LFTs, hypophosphatemia as well as hypernatremia.  We will also need to monitor his blood count is now 12.0/36.6 we will check anemia panel in the a.m.  We will continue to monitor patient's clinical response to intervention and repeat blood work in the a.m.

## 2022-07-05 DIAGNOSIS — E1110 Type 2 diabetes mellitus with ketoacidosis without coma: Secondary | ICD-10-CM | POA: Diagnosis not present

## 2022-07-05 LAB — GLUCOSE, CAPILLARY
Glucose-Capillary: 157 mg/dL — ABNORMAL HIGH (ref 70–99)
Glucose-Capillary: 221 mg/dL — ABNORMAL HIGH (ref 70–99)
Glucose-Capillary: 231 mg/dL — ABNORMAL HIGH (ref 70–99)
Glucose-Capillary: 242 mg/dL — ABNORMAL HIGH (ref 70–99)
Glucose-Capillary: 247 mg/dL — ABNORMAL HIGH (ref 70–99)
Glucose-Capillary: 269 mg/dL — ABNORMAL HIGH (ref 70–99)
Glucose-Capillary: 364 mg/dL — ABNORMAL HIGH (ref 70–99)

## 2022-07-05 LAB — VITAMIN B12: Vitamin B-12: 462 pg/mL (ref 180–914)

## 2022-07-05 LAB — CBC WITH DIFFERENTIAL/PLATELET
Abs Immature Granulocytes: 0.04 10*3/uL (ref 0.00–0.07)
Basophils Absolute: 0.1 10*3/uL (ref 0.0–0.1)
Basophils Relative: 1 %
Eosinophils Absolute: 0 10*3/uL (ref 0.0–0.5)
Eosinophils Relative: 0 %
HCT: 39.1 % (ref 39.0–52.0)
Hemoglobin: 12.7 g/dL — ABNORMAL LOW (ref 13.0–17.0)
Immature Granulocytes: 1 %
Lymphocytes Relative: 15 %
Lymphs Abs: 1.2 10*3/uL (ref 0.7–4.0)
MCH: 28.7 pg (ref 26.0–34.0)
MCHC: 32.5 g/dL (ref 30.0–36.0)
MCV: 88.5 fL (ref 80.0–100.0)
Monocytes Absolute: 0.3 10*3/uL (ref 0.1–1.0)
Monocytes Relative: 4 %
Neutro Abs: 6.3 10*3/uL (ref 1.7–7.7)
Neutrophils Relative %: 79 %
Platelets: 139 10*3/uL — ABNORMAL LOW (ref 150–400)
RBC: 4.42 MIL/uL (ref 4.22–5.81)
RDW: 12.8 % (ref 11.5–15.5)
WBC: 8 10*3/uL (ref 4.0–10.5)
nRBC: 0 % (ref 0.0–0.2)

## 2022-07-05 LAB — RETICULOCYTES
Immature Retic Fract: 14.9 % (ref 2.3–15.9)
RBC.: 4.4 MIL/uL (ref 4.22–5.81)
Retic Count, Absolute: 17.6 10*3/uL — ABNORMAL LOW (ref 19.0–186.0)
Retic Ct Pct: 0.4 % (ref 0.4–3.1)

## 2022-07-05 LAB — COMPREHENSIVE METABOLIC PANEL
ALT: 36 U/L (ref 0–44)
AST: 36 U/L (ref 15–41)
Albumin: 2.3 g/dL — ABNORMAL LOW (ref 3.5–5.0)
Alkaline Phosphatase: 90 U/L (ref 38–126)
Anion gap: 9 (ref 5–15)
BUN: 45 mg/dL — ABNORMAL HIGH (ref 8–23)
CO2: 25 mmol/L (ref 22–32)
Calcium: 8.9 mg/dL (ref 8.9–10.3)
Chloride: 113 mmol/L — ABNORMAL HIGH (ref 98–111)
Creatinine, Ser: 1.2 mg/dL (ref 0.61–1.24)
GFR, Estimated: >60 mL/min (ref >60)
Glucose, Bld: 217 mg/dL — ABNORMAL HIGH (ref 70–99)
Potassium: 3.8 mmol/L (ref 3.5–5.1)
Sodium: 147 mmol/L — ABNORMAL HIGH (ref 135–145)
Total Bilirubin: 0.6 mg/dL (ref 0.3–1.2)
Total Protein: 6.4 g/dL — ABNORMAL LOW (ref 6.5–8.1)

## 2022-07-05 LAB — IRON AND TIBC
Iron: 16 ug/dL — ABNORMAL LOW (ref 45–182)
Saturation Ratios: 9 % — ABNORMAL LOW (ref 17.9–39.5)
TIBC: 182 ug/dL — ABNORMAL LOW (ref 250–450)
UIBC: 166 ug/dL

## 2022-07-05 LAB — MAGNESIUM: Magnesium: 1.9 mg/dL (ref 1.7–2.4)

## 2022-07-05 LAB — FOLATE: Folate: 14.2 ng/mL (ref >5.9)

## 2022-07-05 LAB — PHOSPHORUS: Phosphorus: 2.8 mg/dL (ref 2.5–4.6)

## 2022-07-05 LAB — FERRITIN: Ferritin: 465 ng/mL — ABNORMAL HIGH (ref 24–336)

## 2022-07-05 MED ORDER — INSULIN ASPART 100 UNIT/ML IJ SOLN
0.0000 [IU] | Freq: Three times a day (TID) | INTRAMUSCULAR | Status: DC
  Administered 2022-07-06: 5 [IU] via SUBCUTANEOUS

## 2022-07-05 MED ORDER — ADULT MULTIVITAMIN W/MINERALS CH
1.0000 | ORAL_TABLET | Freq: Every day | ORAL | Status: DC
  Administered 2022-07-05: 1 via ORAL
  Filled 2022-07-05: qty 1

## 2022-07-05 MED ORDER — PROSOURCE PLUS PO LIQD
30.0000 mL | Freq: Every day | ORAL | Status: DC
Start: 2022-07-05 — End: 2022-07-06
  Administered 2022-07-05: 30 mL via ORAL
  Filled 2022-07-05: qty 30

## 2022-07-05 MED ORDER — GLUCERNA SHAKE PO LIQD
237.0000 mL | ORAL | Status: DC
  Administered 2022-07-05: 237 mL via ORAL
  Filled 2022-07-05: qty 237

## 2022-07-05 MED ORDER — ENSURE MAX PROTEIN PO LIQD
11.0000 [oz_av] | Freq: Every day | ORAL | Status: DC
  Filled 2022-07-05: qty 330

## 2022-07-05 MED ORDER — GLUCERNA SHAKE PO LIQD
237.0000 mL | Freq: Three times a day (TID) | ORAL | Status: DC
  Filled 2022-07-05: qty 237

## 2022-07-05 MED ORDER — SODIUM CHLORIDE 0.45 % IV SOLN: INTRAVENOUS | Status: DC

## 2022-07-05 NOTE — Inpatient Diabetes Management (Signed)
Inpatient Diabetes Program Recommendations  AACE/ADA: New Consensus Statement on Inpatient Glycemic Control (2015)  Target Ranges:  Prepandial:   less than 140 mg/dL      Peak postprandial:   less than 180 mg/dL (1-2 hours)      Critically ill patients:  140 - 180 mg/dL   Lab Results  Component Value Date   GLUCAP 269 (H) 07/05/2022   HGBA1C 8.5 (H) 07/03/2022    Review of Glycemic Control  Latest Reference Range & Units 07/05/22 04:26 07/05/22 08:09 07/05/22 12:12 07/05/22 15:59  Glucose-Capillary 70 - 99 mg/dL 157 (H) 221 (H) 364 (H) 269 (H)  (H): Data is abnormally high Diabetes history: New diagnosis Outpatient Diabetes medications: None/Decadron with chemo Current orders for Inpatient glycemic control:  Novolog sensitive q 4 hours Semglee 20 units daily  Inpatient Diabetes Program Recommendations:    Spoke with patient and wife regarding new diagnosis of DM. Discussed A1C results with him and explained what an A1C is, basic pathophysiology of DM Type 2, basic home care, importance of checking CBGs and maintaining good CBG control to prevent long-term and short-term complications.  Reviewed signs and symptoms of hyperglycemia and hypoglycemia.  Patient has educational booklet and insulin starter kit at bedside.  Encouraged patient to f/u closely with PCP and MD's at [facility] regarding insulin adjustments, etc. MD has ordered CGM at d/c however patient has scheduled CT scan tomorrow at [facility].  Therefore taught patient and wife how to apply sensor after the scan tomorrow (so that it is not removed).  Will also send video to wife regarding how to apply Freestyle CGM.  Reviewed application process, normal blood sugar values, importance of the arrows, and how to treat low blood sugars.   Educated patient and spouse on insulin pen use at home.  Reviewed contents of insulin flexpen starter kit.  Reviewed all steps if insulin pen including attachment of needle, 2-unit air shot, dialing up dose,  giving injection, removing needle, disposal of sharps, storage of unused insulin, disposal of insulin etc.  Patient able to provide successful return demonstration.  Also reviewed troubleshooting with insulin pen.  MD to give patient Rxs for insulin pens and insulin pen needles.   Note plans for patient to d/c early tomorrow.  Will follow.   Thanks,  Adah Perl, RN, BC-ADM Inpatient Diabetes Coordinator Pager [phone]  (8a-5p)

## 2022-07-05 NOTE — Progress Notes (Signed)
PROGRESS NOTE  Micheal Guerrero LHT:[id number] DOB: 1956-10-22 DOA: 07/03/2022 PCP: Biagio Borg, MD   LOS: 2 days   Brief Narrative / Interim history: 66 year old male with history of pancreatic cancer status post Whipple procedure, with mets to the peritoneum currently undergoing chemotherapy at [facility] was admitted with generalized weakness, found to be in DKA.  He was placed on insulin infusion admitted to stepdown  Subjective / 24h Interval events: DKA seems to be better this morning, he was just transitioned to long-acting 20 units as well as sliding scale.  Assesement and Plan: Principal Problem:   DKA (diabetic ketoacidosis) ([place]) Active Problems:   Dehydration   Hypernatremia   Generalized weakness   Hypercalcemia   AKI (acute kidney injury) ([place])   GERD (gastroesophageal reflux disease)   Principal problem DKA, new onset diabetes mellitus-A1c is 8.5 which is a little bit surprising given his CBGs on admission.  Stop insulin infusion this morning, placed on glargine and sliding scale and continue to closely monitor.  Appreciate diabetes coordinator follow-up  Active problems Acute kidney injury-prerenal, creatinine improved with hydration  Hypernatremia-likely true dehydration.  Sodium improving, continue fluids until tomorrow  Generalized weakness-due to #1  Hypercalcemia-possibly related to his malignancy.  Improved with fluid resuscitation  Pancreatic cancer-outpatient follow-up with [facility]  Thrombocytopenia-likely due to his underlying malignancy.  Scheduled Meds:  alteplase  2 mg Intracatheter Once   Chlorhexidine Gluconate Cloth  6 each Topical Daily   famotidine  20 mg Oral Daily   feeding supplement (GLUCERNA SHAKE)  237 mL Oral TID BM   free water  200 mL Per Tube Q4H   insulin aspart  0-9 Units Subcutaneous Q4H   insulin glargine-yfgn  20 Units Subcutaneous Daily   Continuous Infusions:  sodium chloride 50 mL/hr at 07/05/22 1222   PRN  Meds:.acetaminophen **OR** acetaminophen, dextrose, HYDROcodone-acetaminophen, mouth rinse  Diet Orders (From admission, onward)     Start     Ordered   07/04/22 0958  Diet Carb Modified Fluid consistency: Thin; Room service appropriate? Yes  Diet effective now       Question Answer Comment  Diet-HS Snack? Nothing   Calorie Level Medium 1600-2000   Fluid consistency: Thin   Room service appropriate? Yes      07/04/22 0957            DVT prophylaxis: SCDs Start: 07/04/22 0033 Place and maintain sequential compression device Start: 07/04/22 0031   Lab Results  Component Value Date   PLT 139 (L) 07/05/2022      Code Status: Full Code  Family Communication: wife at bedside  Status is: Inpatient  Remains inpatient appropriate because: Adjusting insulin regimen  Level of care: Med-Surg  Consultants:  none  Objective: Vitals:   07/05/22 0800 07/05/22 0839 07/05/22 0900 07/05/22 1000  BP: 122/76  120/74 103/77  Pulse: (!) 107  (!) 113 (!) 110  Resp: (!) 26  (!) 21 (!) 28  Temp:  97.7 F (36.5 C)    TempSrc:  Oral    SpO2: 96%  97% 96%  Weight:      Height:        Intake/Output Summary (Last 24 hours) at 07/05/2022 1309 Last data filed at 07/05/2022 0800 Gross per 24 hour  Intake 1037.67 ml  Output 100 ml  Net 937.67 ml   Wt Readings from Last 3 Encounters:  07/04/22 59.9 kg  05/15/22 68 kg  11/30/21 71.7 kg    Examination:  Constitutional: NAD Eyes: no  scleral icterus ENMT: Mucous membranes are moist.  Neck: normal, supple Respiratory: clear to auscultation bilaterally, no wheezing, no crackles. Cardiovascular: Regular rate and rhythm, no murmurs / rubs / gallops.  Abdomen: non distended, no tenderness. Bowel sounds positive.  Musculoskeletal: no clubbing / cyanosis.  Skin: no rashes Neurologic: non focal   Data Reviewed: I have independently reviewed following labs and imaging studies   CBC Recent Labs  Lab 07/03/22 1814 07/03/22 1820  07/04/22 0143 07/05/22 0630  WBC 8.3  --  6.0 8.0  HGB 14.4 15.0 12.0* 12.7*  HCT 45.0 44.0 36.6* 39.1  PLT 340  --  209 139*  MCV 88.6  --  88.4 88.5  MCH 28.3  --  29.0 28.7  MCHC 32.0  --  32.8 32.5  RDW 12.9  --  12.7 12.8  LYMPHSABS 0.9  --  0.8 1.2  MONOABS 0.3  --  0.2 0.3  EOSABS 0.0  --  0.0 0.0  BASOSABS 0.0  --  0.0 0.1    Recent Labs  Lab 07/03/22 1814 07/03/22 1819 07/03/22 1820 07/03/22 2020 07/04/22 0143 07/04/22 0958 07/04/22 1648 07/05/22 0630  NA 146*  --    < > 148* 152* 157* 149* 147*  K 4.6  --    < > 4.5 3.8 3.6 4.2 3.8  CL 101  --   --  108 116* 122* 116* 113*  CO2 21*  --   --  21* '27 29 26 25  '$ GLUCOSE 746*  --   --  596* 253* 167* 303* 217*  BUN 68*  --   --  61* 51* 49* 43* 45*  CREATININE 2.12*  --   --  1.53* 1.33* 1.22 1.06 1.20  CALCIUM 11.5*  --   --  10.5* 9.9 9.8 9.1 8.9  AST 14*  --   --   --  20  --   --  36  ALT 46*  --   --   --  39  --   --  36  ALKPHOS 123  --   --   --  97  --   --  90  BILITOT 0.5  --   --   --  0.3  --   --  0.6  ALBUMIN 4.1  --   --   --  2.7*  --   --  2.3*  MG  --   --   --   --  2.8* 2.3  --  1.9  TSH  --   --   --   --   --  1.435  --   --   HGBA1C  --  8.5*  --   --   --   --   --   --    < > = values in this interval not displayed.    ------------------------------------------------------------------------------------------------------------------ No results for input(s): "CHOL", "HDL", "LDLCALC", "TRIG", "CHOLHDL", "LDLDIRECT" in the last 72 hours.  Lab Results  Component Value Date   HGBA1C 8.5 (H) 07/03/2022   ------------------------------------------------------------------------------------------------------------------ Recent Labs    07/04/22 0958  TSH 1.435    Cardiac Enzymes No results for input(s): "CKMB", "TROPONINI", "MYOGLOBIN" in the last 168 hours.  Invalid input(s):  "CK" ------------------------------------------------------------------------------------------------------------------ No results found for: "BNP"  CBG: Recent Labs  Lab 07/04/22 2136 07/05/22 0007 07/05/22 0426 07/05/22 0809 07/05/22 1212  GLUCAP 313* 247* 157* 221* 364*    Recent Results (from the past 240 hour(s))  MRSA Next Gen  by PCR, Nasal     Status: None   Collection Time: 07/03/22 10:01 PM   Specimen: Nasal Mucosa; Nasal Swab  Result Value Ref Range Status   MRSA by PCR Next Gen NOT DETECTED NOT DETECTED Final    Comment: (NOTE) The GeneXpert MRSA Assay (FDA approved for NASAL specimens only), is one component of a comprehensive MRSA colonization surveillance program. It is not intended to diagnose MRSA infection nor to guide or monitor treatment for MRSA infections. Test performance is not FDA approved in patients less than 41 years old. Performed at [facility], [place] [address], [place], [place] [address]      Radiology Studies: No results found.   Marzetta Board, MD, PhD [facility]  Between 7 am - 7 pm I am available, please contact me via Amion (for emergencies) or Securechat (non urgent messages)  Between 7 pm - 7 am I am not available, please contact night coverage MD/APP via Amion

## 2022-07-05 NOTE — Discharge Instructions (Addendum)
Carbohydrate Counting For People With Diabetes ° °Foods with carbohydrates make your blood glucose level go up. Learning how to count carbohydrates can help you control your blood glucose levels. First, identify the foods you eat that contain carbohydrates. Then, using the Foods with Carbohydrates chart, determine about how much carbohydrates are in your meals and snacks. Make sure you are eating foods with fiber, protein, and healthy fat along with your carbohydrate foods. °Foods with Carbohydrates °The following table shows carbohydrate foods that have about 15 grams of carbohydrate each. Using measuring cups, spoons, or a food scale when you first begin learning about carbohydrate counting can help you learn about the portion sizes you typically eat. °The following foods have 15 grams carbohydrate each:  °Grains °1 slice bread (1 ounce)  °1 small tortilla (6-inch size)  °¼ large bagel (1 ounce)  °1/3 cup pasta or rice (cooked)  °½ hamburger or hot dog bun (¾ ounce)  °½ cup cooked cereal  °½ to ¾ cup ready-to-eat cereal  °2 taco shells (5-inch size) Fruit °1 small fresh fruit (¾ to 1 cup)  °½ medium banana  °17 small grapes (3 ounces)  °1 cup melon or berries  °½ cup canned or frozen fruit  °2 tablespoons dried fruit (blueberries, cherries, cranberries, raisins)  °½ cup unsweetened fruit juice  °Starchy Vegetables °½ cup cooked beans, peas, corn, potatoes/sweet potatoes  °¼ large baked potato (3 ounces)  °1 cup acorn or butternut squash  Snack Foods °3 to 6 crackers  °8 potato chips or 13 tortilla chips (¾ ounce to 1 ounce)  °3 cups popped popcorn  °Dairy °3/4 cup (6 ounces) nonfat plain yogurt, or yogurt with sugar-free sweetener  °1 cup milk  °1 cup plain rice, soy, coconut or flavored almond milk Sweets and Desserts °½ cup ice cream or frozen yogurt  °1 tablespoon jam, jelly, pancake syrup, table sugar, or honey  °2 tablespoons light pancake syrup  °1 inch square of frosted cake or 2 inch square of unfrosted  cake  °2 small cookies (2/3 ounce each) or ¼ large cookie  °Sometimes you’ll have to estimate carbohydrate amounts if you don’t know the exact recipe. One cup of mixed foods like soups can have 1 to 2 carbohydrate servings, while some casseroles might have 2 or more servings of carbohydrate. °Foods that have less than 20 calories in each serving can be counted as “free” foods. Count 1 cup raw vegetables, or ½ cup cooked non-starchy vegetables as “free” foods. If you eat 3 or more servings at one meal, then count them as 1 carbohydrate serving.  °Foods without Carbohydrates  °Not all foods contain carbohydrates. Meat, some dairy, fats, non-starchy vegetables, and many beverages don’t contain carbohydrate. So when you count carbohydrates, you can generally exclude chicken, pork, beef, fish, seafood, eggs, tofu, cheese, butter, sour cream, avocado, nuts, seeds, olives, mayonnaise, water, black coffee, unsweetened tea, and zero-calorie drinks. Vegetables with no or low carbohydrate include green beans, cauliflower, tomatoes, and onions. °How much carbohydrate should I eat at each meal?  °Carbohydrate counting can help you plan your meals and manage your weight. Following are some starting points for carbohydrate intake at each meal. Work with your registered dietitian nutritionist to find the best range that works for your blood glucose and weight.  ° To Lose Weight To Maintain Weight  °Women 2 - 3 carb servings 3 - 4 carb servings  °Men 3 - 4 carb servings 4 - 5 carb servings  °Checking your   blood glucose after meals will help you know if you need to adjust the timing, type, or number of carbohydrate servings in your meal plan. Achieve and keep a healthy body weight by balancing your food intake and physical activity.  Tips How should I plan my meals?  Plan for half the food on your plate to include non-starchy vegetables, like salad greens, broccoli, or carrots. Try to eat 3 to 5 servings of non-starchy vegetables  every day. Have a protein food at each meal. Protein foods include chicken, fish, meat, eggs, or beans (note that beans contain carbohydrate). These two food groups (non-starchy vegetables and proteins) are low in carbohydrate. If you fill up your plate with these foods, you will eat less carbohydrate but still fill up your stomach. Try to limit your carbohydrate portion to  of the plate.  What fats are healthiest to eat?  Diabetes increases risk for heart disease. To help protect your heart, eat more healthy fats, such as olive oil, nuts, and avocado. Eat less saturated fats like butter, cream, and high-fat meats, like bacon and sausage. Avoid trans fats, which are in all foods that list "partially hydrogenated oil" as an ingredient. What should I drink?  Choose drinks that are not sweetened with sugar. The healthiest choices are water, carbonated or seltzer waters, and tea and coffee without added sugars.  Sweet drinks will make your blood glucose go up very quickly. One serving of soda or energy drink is  cup. It is best to drink these beverages only if your blood glucose is low.  Artificially sweetened, or diet drinks, typically do not increase your blood glucose if they have zero calories in them. Read labels of beverages, as some diet drinks do have carbohydrate and will raise your blood glucose. Label Reading Tips Read Nutrition Facts labels to find out how many grams of carbohydrate are in a food you want to eat. Don't forget: sometimes serving sizes on the label aren't the same as how much food you are going to eat, so you may need to calculate how much carbohydrate is in the food you are serving yourself.   Carbohydrate Counting for People with Diabetes Sample 1-Day Menu  Breakfast  cup yogurt, low fat, low sugar (1 carbohydrate serving)   cup cereal, ready-to-eat, unsweetened (1 carbohydrate serving)  1 cup strawberries (1 carbohydrate serving)   cup almonds ( carbohydrate serving)   Lunch 1, 5 ounce can chunk light tuna  2 ounces cheese, low fat cheddar  6 whole wheat crackers (1 carbohydrate serving)  1 small apple (1 carbohydrate servings)   cup carrots ( carbohydrate serving)   cup snap peas  1 cup 1% milk (1 carbohydrate serving)   Evening Meal Stir fry made with: 3 ounces chicken  1 cup brown rice (3 carbohydrate servings)   cup broccoli ( carbohydrate serving)   cup green beans   cup onions  1 tablespoon olive oil  2 tablespoons teriyaki sauce ( carbohydrate serving)  Evening Snack 1 extra small banana (1 carbohydrate serving)  1 tablespoon peanut butter   Carbohydrate Counting for People with Diabetes Vegan Sample 1-Day Menu  Breakfast 1 cup cooked oatmeal (2 carbohydrate servings)   cup blueberries (1 carbohydrate serving)  2 tablespoons flaxseeds  1 cup soymilk fortified with calcium and vitamin D  1 cup coffee  Lunch 2 slices whole wheat bread (2 carbohydrate servings)   cup baked tofu   cup lettuce  2 slices tomato  2 slices avocado     cup baby carrots ( carbohydrate serving)  1 orange (1 carbohydrate serving)  1 cup soymilk fortified with calcium and vitamin D   Evening Meal Burrito made with: 1 6-inch corn tortilla (1 carbohydrate serving)  1 cup refried vegetarian beans (2 carbohydrate servings)   cup chopped tomatoes   cup lettuce   cup salsa  1/3 cup brown rice (1 carbohydrate serving)  1 tablespoon olive oil for rice   cup zucchini   Evening Snack 6 small whole grain crackers (1 carbohydrate serving)  2 apricots ( carbohydrate serving)   cup unsalted peanuts ( carbohydrate serving)    Carbohydrate Counting for People with Diabetes Vegetarian (Lacto-Ovo) Sample 1-Day Menu  Breakfast 1 cup cooked oatmeal (2 carbohydrate servings)   cup blueberries (1 carbohydrate serving)  2 tablespoons flaxseeds  1 egg  1 cup 1% milk (1 carbohydrate serving)  1 cup coffee  Lunch 2 slices whole wheat bread (2 carbohydrate  servings)  2 ounces low-fat cheese   cup lettuce  2 slices tomato  2 slices avocado   cup baby carrots ( carbohydrate serving)  1 orange (1 carbohydrate serving)  1 cup unsweetened tea  Evening Meal Burrito made with: 1 6-inch corn tortilla (1 carbohydrate serving)   cup refried vegetarian beans (1 carbohydrate serving)   cup tomatoes   cup lettuce   cup salsa  1/3 cup brown rice (1 carbohydrate serving)  1 tablespoon olive oil for rice   cup zucchini  1 cup 1% milk (1 carbohydrate serving)  Evening Snack 6 small whole grain crackers (1 carbohydrate serving)  2 apricots ( carbohydrate serving)   cup unsalted peanuts ( carbohydrate serving)    Copyright 2020  Academy of [employer]etics. All rights reserved.  Using Nutrition Labels: Carbohydrate  Serving Size  Look at the serving size. All the information on the label is based on this portion. Servings Per Container  The number of servings contained in the package. Guidelines for Carbohydrate  Look at the total grams of carbohydrate in the serving size.  1 carbohydrate choice = 15 grams of carbohydrate. Range of Carbohydrate Grams Per Choice  Carbohydrate Grams/Choice Carbohydrate Choices  6-10   11-20 1  21-25 1  26-35 2  36-40 2  41-50 3  51-55 3  56-65 4  66-70 4  71-80 5    Copyright 2020  Academy of [employer] Dietetics. All rights reserved.     High Calorie, High Protein Nutrition Therapy  A high-calorie, high-protein diet has been recommended to you. Your registered dietitian nutritionist (RDN) may have recommended this diet because you are having difficulty eating enough calories throughout the day, you have lost weight, and/or you need to add protein to your diet.  Sometimes you may not feel like eating, even if you know the importance of good nutrition. The recommendations in this handout can help you with the following:  Regaining your strength and energy Keeping your  body healthy Healing and recovering from surgery or illness and fighting infection  Schedule Your Meals and Snacks  Several small meals and snacks are often better tolerated and digested than large meals.  Strategies  Plan to eat 3 meals and 3 snacks daily. Experiment with timing meals to find out when you have a larger appetite. Appetite may be greatest in the morning after not eating all night so you may prefer to eat your larger meals and snacks in the morning and at lunch. Breakfast-type foods are often better tolerated so  eat foods such as eggs, pancakes, waffles and cereal for any meal or snack. Carry snacks with you so you are prepared to eat every 2 to 3 hours. Determine what works best for you if your body's cues for feeling hungry or full are not working. Eat a small meal or snack even if you don't feel hungry. Set a timer to remind you when it is time to eat. Take a walk before you eat (with health care provider's approval). Light or moderate physical activity can help you maintain muscle and increase your appetite.  Make Eating Enjoyable  Taking steps to make the experience enjoyable may help to increase your interest in eating and improve your appetite.  Strategies:  Eat with others whenever possible. Include your favorite foods to make meals more enjoyable. Try new foods. Save your beverage for the end of the meal so that you have more room for food before you get full.  Add Calories to Your Meals and Snacks  Try adding calorie-dense foods so that each bite provides more nutrition.  Strategies  Drink milk, chocolate milk, soy milk, or smoothies instead of low-calorie beverages such as diet drinks or water. Cook with milk or soy milk instead of water when making dishes such as hot cereal, cocoa, or pudding. Add jelly, jam, honey, butter or margarine to bread and crackers. Add jam or fruit to ice cream and as a topping over cake. Mix dried fruit, nuts, granola,  honey, or dry cereal with yogurt or hot cereals. Enjoy snacks such as milkshakes, smoothies, pudding, ice cream, or custard. Blend a fruit smoothie of a banana, frozen berries, milk or soy milk, and 1 tablespoon nonfat powdered milk or protein powder.  Add Protein to Your Meals and Snacks  Choose at least one protein food at each meal and snack to increase your daily intake.  Strategies  Add  cup nonfat dry milk powder or protein powder to make a high-protein milk to drink or to use in recipes that call for milk. Vanilla or peppermint extract or unsweetened cocoa powder could help to boost the flavor. Add hard-cooked eggs, leftover meat, grated cheese, canned beans or tofu to noodles, rice, salads, sandwiches, soups, casseroles, pasta, tuna and other mixed dishes. Add powdered milk or protein powder to hot cereals, meatloaf, casseroles, scrambled eggs, sauces, cream soups, and shakes. Add beans and lentils to salads, soups, casseroles, and vegetable dishes. Eat cottage cheese or yogurt, especially Greek yogurt, with fruit as a snack or dessert. Eat peanut or other nut butters on crackers, bread, toast, waffles, apples, bananas or celery sticks. Add it to milkshakes, smoothies, or desserts. Consider a ready-made protein shake.   Add Fats to Your Meals and Snacks  Try adding fats to your meals and snacks. Fat provides more calories in fewer bites than carbohydrate or protein and adds flavors to your foods.  Strategies  Snack on nuts and seeds or add them to foods like salads, pasta, cereals, yogurt, and ice cream.  Saut or stir-fry vegetables, meats, chicken, fish or tofu in olive or canola oil.  Add olive oil, other vegetable oils, butter or margarine to soups, vegetables, potatoes, cooked cereal, rice, pasta, bread, crackers, pancakes, or waffles. Snack on olives or add to pasta, pizza, or salad. Add avocado or guacamole to your salads, sandwiches, and other entrees. Include fatty fish  such as salmon in your weekly meal plan.  Tips For Adding Protein Nutrition Therapy    Tips Add extra egg to one  or more meals  Increase the portion of milk to drink and change to skim milk if able  Include [place] yogurt or cottage cheese for snack or part of a meal  Increase portion size of protein entre and decrease portion of starch/bread  Mix protein powder, nut butter, almond/nut milk, non-fat dry milk, or Greek yogurt to shakes and smoothies  Use these ingredients also in baked goods or other recipes Use double the amount of sandwich filling  Add protein foods to all snacks including cheese, nut butters, milk and yogurt  Food Tips for Including Protein  Beans Cook and use dried peas, beans, and tofu in soups or add to casseroles, pastas, and grain dishes that also contain cheese or meat  Mash with cheese and milk  Use tofu to make smoothies  Commercial Protein Supplements Use nutritional supplements or protein powder sold at pharmacies and grocery stores  Use protein powder in milk drinks and desserts, such as pudding  Mix with ice cream, milk, and fruit or other flavorings for a high-protein milkshake  Cottage Cheese or [employer] Mix with or use to stuff fruits and vegetables  Add to casseroles, spaghetti, noodles, or egg dishes such as omelets, scrambled eggs, and souffls  Use gelatin, pudding-type desserts, cheesecake, and pancake or waffle batter  Use to stuff crepes, pasta shells, or manicotti  Puree and use as a substitute for sour cream  Eggs, Egg whites, and Egg Yolks Add chopped, hard-cooked eggs to salads and dressings, vegetables, casseroles, and creamed meats  Beat eggs into mashed potatoes, vegetable purees, and sauces  Add extra egg whites to quiches, scrambled eggs, custards, puddings, pancake batter, or [place] toast wash/batter  Make a rich custard with egg yolks, double strength milk, and sugar  Add extra hard-cooked yolks to deviled egg filling and  sandwich spreads  Hard or Semi-Soft Cheese (Cheddar, Barnabas Lister, [place]) Melt on sandwiches, bread, muffins, tortillas, hamburgers, hot dogs, other meats or fish, vegetables, eggs, or desserts such as stewed figs or pies  Grate and add to soups, sauces, casseroles, vegetable dishes, potatoes, rice noodles, or meatloaf  Serve as a snack with crackers or bagels  Ice cream, Yogurt, and Frozen Yogurt Add to milk drinks such as milkshakes  Add to cereals, fruits, gelatin desserts, and pies  Blend or whip with soft or cooked fruits  Sandwich ice cream or frozen yogurt between enriched cake slices, cookies, or graham crackers  Use seasoned yogurt as a dip for fruits, vegetables, or chips  Use yogurt in place of sour cream in casseroles  Meat and Fish Add chopped, cooked meat or fish to vegetables, salads, casseroles, soups, sauces, and biscuit dough  Use in omelets, souffls, quiches, and sandwich fillings  Add chicken and [place] to stuffing  Wrap in pie crust or biscuit dough as turnovers  Add to stuffed baked potatoes  Add pureed meat to soups  Milk Use in beverages and in cooking  Use in preparing foods, such as hot cereal, soups, cocoa, or pudding  Add cream sauces to vegetable and other dishes  Use evaporated milk, evaporated skim milk, or sweetened condensed milk instead of milk or water in recipes.  Nonfat Dry Milk Add 1/3 cup of nonfat dry milk powdered milk to each cup of regular milk for "double strength" milk  Add to yogurt and milk drinks, such as pasteurized eggnog and milkshakes  Add to scrambled eggs and mashed potatoes  Use in casseroles, meatloaf, hot cereal, breads, muffins, sauces, cream soups,  puddings and custards, and other milk-based desserts  Nuts, Seeds, and Wheat Germ Add to casseroles, breads, muffins, pancakes, cookies, and waffles  Sprinkle on fruit, cereal, ice cream, yogurt, vegetables, salads, and toast as a crunchy topping  Use in place of breadcrumbs  Blend with parsley  or spinach, herbs, and cream for a noodle, pasta, or vegetable sauce.  Roll banana in chopped nuts  Peanut Butter Spread on sandwiches, toast, muffins, crackers, waffles, pancakes, and fruit slices  Use as a dip for raw vegetables, such as carrots, cauliflower, and celery  Blend with milk drinks, smoothies, and other beverages  Swirl through soft ice cream or yogurt  Spread on a banana then roll in crushed, dry cereal or chopped nuts   Small Meal and Snack Ideas  These snacks and meals are recommended when you have to eat but aren't necessarily hungry.  They are good choices because they are high in protein and high in calories.   2 graham crackers, 2 tablespoons peanut or other nut butter, 1 cup milk  cup [place] yogurt,  cup fruit,  cup granola 2 deviled egg halves, 5 whole wheat crackers 1 cup cream of tomato soup,  grilled cheese sandwich 1 toasted waffle topped with: 2 tablespoons peanut or nut butter, 1 tablespoon jam Trail mix made with:  cup nuts,  cup dried fruit,  cup cold cereal, any variety  cup oatmeal or cream of wheat cereal, 1 tablespoon peanut or nut butter,  cup diced fruit  High-Calorie, High-Protein Sample 1-Day Menu   Breakfast 1 egg, scrambled 1 ounce cheddar cheese 1 English muffin, whole wheat 1 tablespoon margarine 1 tablespoon jam  cup orange juice, fortified with calcium and vitamin D  Morning Snack 1 tablespoon peanut butter 1 banana 1 cup 1% milk  Lunch Tuna salad sandwich made with: 2 slices bread, whole wheat 3 ounces tuna mixed with: 1 tablespoon mayonnaise  cup pudding  Afternoon Snack  cup hummus  cup carrots 1 pita  Evening Meal Enchilada casserole made with: 2 corn tortillas 3 ounces ground beef, cooked  cup black beans, cooked  cup corn, cooked 1 ounce grated cheddar cheese  cup enchilada sauce  avocado, sliced, topping for enchilada 1 tablespoon sour cream, topping for enchilada Salad:  cup lettuce,  shredded  cup tomatoes, chopped, for salad 1 tablespoon olive oil and vinegar dressing, for salad  Evening Snack  cup Greek yogurt  cup blueberries  cup granola  Copyright 2020  Academy of [employer] and Dietetics. All rights reserved    Please take short acting insulin (Novolog) 3 times daily before  meals as following CBG 121 - 150: 1 unit CBG 151 - 200: 2 units CBG 201 - 250: 3 units CBG 251 - 300: 5 units CBG 301 - 350: 7 units CBG 351 - 400: 9 units   Please take Levemir once daily

## 2022-07-05 NOTE — Progress Notes (Signed)
   07/05/22 1656  Assess: MEWS Score  Temp 97.8 F (36.6 C)  BP (!) 124/90  MAP (mmHg) 101  Pulse Rate (!) 114 (RN notified)  Resp 18  Level of Consciousness Alert  SpO2 98 %  Assess: MEWS Score  MEWS Temp 0  MEWS Systolic 0  MEWS Pulse 2  MEWS RR 0  MEWS LOC 0  MEWS Score 2  MEWS Score Color Yellow  Assess: if the MEWS score is Yellow or Red  Were vital signs taken at a resting state? Yes  Focused Assessment No change from prior assessment  Does the patient meet 2 or more of the SIRS criteria? No  Does the patient have a confirmed or suspected source of infection? No  MEWS guidelines implemented *See Row Information* Yes  Treat  MEWS Interventions Other (Comment)  Pain Scale 0-10  Pain Score 0  Take Vital Signs  Increase Vital Sign Frequency  Yellow: Q 2hr X 2 then Q 4hr X 2, if remains yellow, continue Q 4hrs  Escalate  MEWS: Escalate Yellow: discuss with charge nurse/RN and consider discussing with provider and RRT  Notify: Charge Nurse/RN  Name of Charge Nurse/RN Notified Landscape architect  Date Charge Nurse/RN Notified 07/05/22  Time Charge Nurse/RN Notified 1700  Notify: Provider  Provider Name/Title Gherge MD  Date Provider Notified 07/05/22  Time Provider Notified 1700  Method of Notification Page  Notification Reason Other (Comment) (yellow MEWS)  Provider response No new orders  Date of Provider Response 07/05/22  Time of Provider Response 1710  Document  Patient Outcome Stabilized after interventions (remains stable)  Progress note created (see row info) Yes  Assess: SIRS CRITERIA  SIRS Temperature  0  SIRS Pulse 1  SIRS Respirations  0  SIRS WBC 1  SIRS Score Sum  2    Pt. Is a transfer from ICU. Pt. Is alert and oriented, no distress. Pt. Denies chest pain and palpitation. No SOB on room air. CN & MD notified. Will closely monitor pt. YELLOW MEWS activated.

## 2022-07-05 NOTE — Evaluation (Signed)
Physical Therapy Evaluation Patient Details Name: Micheal Guerrero MRN: [id number] DOB: 13-Mar-1956 Today's Date: 07/05/2022  History of Present Illness  Micheal Guerrero is a 66 y.o. male with medical history significant for pancreatic cancer status post Whipple procedure in 2020, undergoing chemotherapy, with mets to peritoneum, who is admitted to [facility] on 07/03/2022 by way of transfer from [facility] emergency department with DKA after presenting from home to the latter facility complaining of generalized weakness.  Clinical Impression  The patient is eager to get OOB,  expresses being frustrated that he can't move without bed alarm. Patient ambulated x 300' with mild  balance loss initially. Patient should improve with increased activity to return to    his home.  HR 114-`136 with activity.   Pt admitted with above diagnosis.  Pt currently with functional limitations due to the deficits listed below (see PT Problem List). Pt will benefit from skilled PT to increase their independence and safety with mobility to allow discharge to the venue listed below.        Recommendations for follow up therapy are one component of a multi-disciplinary discharge planning process, led by the attending physician.  Recommendations may be updated based on patient status, additional functional criteria and insurance authorization.  Follow Up Recommendations No PT follow up      Assistance Recommended at Discharge PRN  Patient can return home with the following  Assist for transportation;Assistance with cooking/housework;Help with stairs or ramp for entrance    Equipment Recommendations None recommended by PT  Recommendations for Other Services       Functional Status Assessment Patient has had a recent decline in their functional status and demonstrates the ability to make significant improvements in function in a reasonable and predictable amount of time.     Precautions /  Restrictions Precautions Precautions: Fall Precaution Comments: Colostomy-high gait belt Restrictions Weight Bearing Restrictions: No      Mobility  Bed Mobility Overal bed mobility: Modified Independent       Supine to sit: HOB elevated          Transfers Overall transfer level: Needs assistance Equipment used: None Transfers: Sit to/from Stand Sit to Stand: Supervision           General transfer comment: mildly unsteady upon standing    Ambulation/Gait Ambulation/Gait assistance: Min guard Gait Distance (Feet): 300 Feet Assistive device: None, 1 person hand held assist Gait Pattern/deviations: Step-through pattern   Gait velocity interpretation: <1.31 ft/sec, indicative of household ambulator   General Gait Details: mildly unsteady  taking first few steps then  improved HHA x 150' then no HHA last 150  Stairs            Wheelchair Mobility    Modified Rankin (Stroke Patients Only)       Balance Overall balance assessment: Mild deficits observed, not formally tested                                           Pertinent Vitals/Pain Pain Assessment Pain Assessment: No/denies pain    Home Living Family/patient expects to be discharged to:: Private residence Living Arrangements: Spouse/significant other Available Help at Discharge: Available PRN/intermittently Type of Home: House Home Access: Stairs to enter Entrance Stairs-Rails: Right Entrance Stairs-Number of Steps: 6 Alternate Level Stairs-Number of Steps: 4 story house, including basement.  Full flight to upstairs bed/bath with  bilateral rails 1st half, then LT rail for 2nd half. Home Layout: Multi-level;Full bath on main level;Other (Comment) Home Equipment: Grab bars - tub/shower;Grab bars - toilet;Shower seat - built in      Prior Function Prior Level of Function : Independent/Modified Independent             Mobility Comments: 2 weeks ago pror to symptom  onset, pt reports total independence with all ambulation and ADLs. ADLs Comments: Was walking 5 miles most days less than 2 weeks ago. No falls. Still working as a Education administrator.     Hand Dominance   Dominant Hand: Right    Extremity/Trunk Assessment   Upper Extremity Assessment Upper Extremity Assessment: Overall WFL for tasks assessed    Lower Extremity Assessment Lower Extremity Assessment: Generalized weakness    Cervical / Trunk Assessment Cervical / Trunk Assessment: Normal  Communication   Communication: HOH  Cognition Arousal/Alertness: Awake/alert Behavior During Therapy: WFL for tasks assessed/performed Overall Cognitive Status: Within Functional Limits for tasks assessed                                          General Comments      Exercises     Assessment/Plan    PT Assessment Patient needs continued PT services  PT Problem List Decreased strength;Decreased activity tolerance;Decreased mobility       PT Treatment Interventions Gait training;Stair training;Functional mobility training;Therapeutic activities;Therapeutic exercise;Patient/family education    PT Goals (Current goals can be found in the Care Plan section)  Acute Rehab PT Goals Patient Stated Goal: walk, be OOB PT Goal Formulation: With patient/family Time For Goal Achievement: 07/19/22 Potential to Achieve Goals: Good    Frequency Min 3X/week     Co-evaluation               AM-PAC PT "6 Clicks" Mobility  Outcome Measure Help needed turning from your back to your side while in a flat bed without using bedrails?: None Help needed moving from lying on your back to sitting on the side of a flat bed without using bedrails?: None Help needed moving to and from a bed to a chair (including a wheelchair)?: None Help needed standing up from a chair using your arms (e.g., wheelchair or bedside chair)?: None Help needed to walk in hospital room?: A  Little Help needed climbing 3-5 steps with a railing? : A Lot 6 Click Score: 21    End of Session Equipment Utilized During Treatment: Gait belt Activity Tolerance: Patient tolerated treatment well Patient left: in chair;with call bell/phone within reach;with chair alarm set Nurse Communication: Mobility status PT Visit Diagnosis: Unsteadiness on feet (R26.81);Difficulty in walking, not elsewhere classified (R26.2)    Time: 5397-6734 PT Time Calculation (min) (ACUTE ONLY): 20 min   Charges:   PT Evaluation $PT Eval Low Complexity: 1 Low          [place] Office [phone] Weekend [phone]   Claretha Cooper 07/05/2022, 9:35 AM

## 2022-07-05 NOTE — TOC Initial Note (Addendum)
Transition of Care [facility]) - Initial/Assessment Note    Patient Details  Name: Micheal Guerrero MRN: [id number] Date of Birth: [birth year]  Transition of Care [facility]) CM/SW Contact:    Dessa Phi, RN Phone Number: 07/05/2022, 11:10 AM  Clinical Narrative: Benefit check in for insulin-await outcome.                -12p-see benefit eligibility note-MD aware.  Expected Discharge Plan: Home/Self Care Barriers to Discharge: Continued Medical Work up   Patient Goals and CMS Choice Patient states their goals for this hospitalization and ongoing recovery are:: Home CMS Medicare.gov Compare Post Acute Care list provided to:: Patient Choice offered to / list presented to : Patient  Expected Discharge Plan and Services Expected Discharge Plan: Home/Self Care   Discharge Planning Services: CM Consult Post Acute Care Choice: NA Living arrangements for the past 2 months: Single Family Home                                      Prior Living Arrangements/Services Living arrangements for the past 2 months: Single Family Home Lives with:: Spouse Patient language and need for interpreter reviewed:: Yes Do you feel safe going back to the place where you live?: Yes      Need for Family Participation in Patient Care: Yes (Comment) Care giver support system in place?: Yes (comment)   Criminal Activity/Legal Involvement Pertinent to Current Situation/Hospitalization: No - Comment as needed  Activities of Daily Living Home Assistive Devices/Equipment: None ADL Screening (condition at time of admission) Patient's cognitive ability adequate to safely complete daily activities?: Yes Is the patient deaf or have difficulty hearing?: No Does the patient have difficulty seeing, even when wearing glasses/contacts?: No Does the patient have difficulty concentrating, remembering, or making decisions?: No Patient able to express need for assistance with ADLs?: Yes Does the patient have  difficulty dressing or bathing?: Yes Independently performs ADLs?: No Communication: Independent Dressing (OT): Independent Grooming: Independent Feeding: Independent Bathing: Independent Toileting: Independent In/Out Bed: Independent Walks in Home: Independent Does the patient have difficulty walking or climbing stairs?: No Weakness of Legs: Both Weakness of Arms/Hands: None  Permission Sought/Granted   Permission granted to share information with : Yes, Verbal Permission Granted  Share Information with NAME: Case Manager           Emotional Assessment Appearance:: Appears stated age Attitude/Demeanor/Rapport: Gracious Affect (typically observed): Accepting Orientation: : Oriented to Self, Oriented to Place, Oriented to  Time, Oriented to Situation Alcohol / Substance Use: Not Applicable Psych Involvement: No (comment)  Admission diagnosis:  DKA (diabetic ketoacidosis) ([place]) [E11.10] Acute kidney injury ([place]) [N17.9] Elevated lipase [R74.8] Steroid-induced hyperglycemia [R73.9, T38.0X5A] Patient Active Problem List   Diagnosis Date Noted   Dehydration 07/04/2022   Hypernatremia 07/04/2022   Generalized weakness 07/04/2022   Hypercalcemia 07/04/2022   AKI (acute kidney injury) ([place]) 07/04/2022   GERD (gastroesophageal reflux disease) 07/04/2022   DKA (diabetic ketoacidosis) ([place]) 07/03/2022   Diarrhea 01/02/2022   Colostomy care ([place]) 01/02/2022   Obstruction of left ureter 08/31/2021   Pyelonephritis 08/26/2021   Sepsis ([place]) 08/26/2021   Hydronephrosis, left 06/17/2021   LLQ pain 02/28/2021   Bilateral carpal tunnel syndrome 11/29/2020   Entrapment of right ulnar nerve 11/29/2020   Bilateral hand pain 09/13/2020   Contracture of palmar fascia 09/13/2020   Numbness 09/13/2020   BPH (benign prostatic hyperplasia) 01/21/2020   Bladder  wall thickening 12/16/2019   Cancer of head of pancreas ([place]) 08/12/2019   Pancreatic mass 07/24/2019   Abdominal pain  07/07/2019   Hyperglycemia 07/22/2018   Elevated systolic blood pressure reading with diagnosis of hypertension 04/29/2017   Loss of transverse plantar arch of left foot 07/19/2015   Capsulitis of foot 07/19/2015   Foot pain, bilateral 07/15/2015   Insomnia 12/21/2011   Encounter for well adult exam with abnormal findings 12/16/2011   Hyperlipidemia 08/19/2009   DIVERTICULOSIS, COLON 08/19/2009   LEG CRAMPS 08/19/2009   PCP:  Biagio Borg, MD Pharmacy:   [facility], [place] [address] Phone: [phone] Fax: [phone]  [id number] [place] [address]: [phone]: [phone] [address] [facility]Phone: [phone]: [phone] [id number] [address] [place]one: [phone]: [phone]   Social Determinants of Health (SDOH) Interventions    Readmission Risk Interventions     No data to display

## 2022-07-05 NOTE — TOC Benefit Eligibility Note (Signed)
Transition of Care [facility]) Benefit Eligibility Note    Patient Details  Name: ODA LANSDOWNE MRN: [id number] Date of Birth: 1956/09/15   Medication/Dose: Novolog/insulin aspart 0-9u SQ q4h/pens;Lantus/insulin glargine injections 100u/ml SQ q4h/pens;levimir/insulin Detemir 100u/ml SQ Q4h.Dx:DKA  Covered?:  (Lantus not covered)  Tier: Other  Prescription Coverage Preferred Pharmacy: local  Spoke with Person/Company/Phone Number:: [employer]/ [employer]  Co-Pay: Novolog is $7.00 and Levimir is $16.99 Lantus not covered  Prior Approval: No  Deductible: Met       Kerin Salen Phone Number: 07/05/2022, 11:57 AM

## 2022-07-05 NOTE — Progress Notes (Addendum)
Initial Nutrition Assessment  DOCUMENTATION CODES:   Not applicable  INTERVENTION:  - continue Glucerna Shake but will decrease to once/day, each supplement provides 220 kcal and 10 grams of protein  - will order Ensure Max once/day, each supplement provides 150 kcal and 30 grams of protein.  - will order 30 ml Prosource Plus once/day, each supplement provides 100 kcal and 15 grams protein.   - will order 1 tablet multivitamin with minerals/day.  - added Carbohydrate Counting handout and High Calorie, High Protein handout to AVS for patient to have as resources after d/c.   - will alert MD that patient takes creon at home--not currently ordered for patient.   NUTRITION DIAGNOSIS:   Increased nutrient needs related to acute illness, cancer and cancer related treatments, chronic illness as evidenced by estimated needs.  GOAL:   Patient will meet greater than or equal to 90% of their needs  MONITOR:   PO intake, Supplement acceptance, Labs, Weight trends  REASON FOR ASSESSMENT:   Malnutrition Screening Tool, Consult Assessment of nutrition requirement/status, Poor PO  ASSESSMENT:   66 year old male with medical history of diverticulosis, HLD, and pancreatic cancer s/p Whipple in 2020, with mets to the peritoneum. He is currently receiving care and undergoing chemo at [facility]. Patient has a colostomy. He presented to the ED due to weakness and was found to be in DKA.  Patient laying in bed at the time of RD visit. Patient receives Oncology care and chemo at [facility].   He shares that he has been working with a RD there and that they have been working on a plan to maximize calories and higher fat foods. He shares that he has been adding scoops of whey protein to smoothies and that he had been able to go without pancreatic enzymes for several years but was started on creon several weeks ago. He is unsure if creon has been beneficial in decreasing abdominal pain.   Patient shares that  some days nothing sounds good or is tolerated well and that it can be upsetting to him when others focus on food preferences.  He shares that he may be discharged tomorrow but that if he will go home or elsewhere is not yet determined.  Patient denies any nutrition-related questions, concerns, or needs at this time though is appreciative of RD offer and empathetic listening as he recounts his experiences.  Weight on 7/31 was 131 lb and weight on 6/12 was 150 lb. This indicates 19 lb weight loss (12.7% body weight) in the past 1.5 months; significant for time frame.   Suspect some degree of malnutrition but unable to confirm with inability to complete NFPE during this visit.    Labs reviewed; CBGs: 247, 157, 221, 364, 269 mg/dl, Na: 147 mmol/l, Cl: 113 mmol/l, BUN: 45 mg/dl.  Medications reviewed; 20 mg oral pepcid/day, 200 ml water every 4 hours, sliding scale novolog, 20 units semglee/day, 30 mmol IV KPhos x1 run 8/1.  IVF; 1/2 NS @ 50 ml/hr.    NUTRITION - FOCUSED PHYSICAL EXAM:  Deferred for patient comfort.  Diet Order:   Diet Order             Diet Carb Modified Fluid consistency: Thin; Room service appropriate? Yes  Diet effective now                   EDUCATION NEEDS:   Education needs have been addressed  Skin:  Skin Assessment: Reviewed RN Assessment  Last BM:  8/2 (type  6 x4, at least one small amount and one medium amount)  Height:   Ht Readings from Last 1 Encounters:  07/03/22 '5\' 9"'$  (1.753 m)    Weight:   Wt Readings from Last 1 Encounters:  07/04/22 59.9 kg     BMI:  Body mass index is 19.5 kg/m.  Estimated Nutritional Needs:  Kcal:  2300-2600 kcal Protein:  115-130 grams Fluid:  >/= 2.5 L/day      Jarome Matin, MS, RD, LDN, CNSC Registered Dietitian II Inpatient Clinical Nutrition RD pager # and on-call/weekend pager # available in [facility]

## 2022-07-06 DIAGNOSIS — E1110 Type 2 diabetes mellitus with ketoacidosis without coma: Secondary | ICD-10-CM | POA: Diagnosis not present

## 2022-07-06 LAB — GLUCOSE, CAPILLARY
Glucose-Capillary: 303 mg/dL — ABNORMAL HIGH (ref 70–99)
Glucose-Capillary: 292 mg/dL — ABNORMAL HIGH (ref 70–99)

## 2022-07-06 MED ORDER — HEPARIN SOD (PORK) LOCK FLUSH 100 UNIT/ML IV SOLN
500.0000 [IU] | INTRAVENOUS | Status: AC | PRN
  Administered 2022-07-06: 500 [IU]

## 2022-07-06 MED ORDER — INSULIN ASPART 100 UNIT/ML FLEXPEN: 0.0000 [IU] | PEN_INJECTOR | Freq: Three times a day (TID) | SUBCUTANEOUS | 1 refills | Status: AC

## 2022-07-06 MED ORDER — INSULIN DETEMIR 100 UNIT/ML FLEXPEN: 28.0000 [IU] | PEN_INJECTOR | Freq: Every day | SUBCUTANEOUS | 1 refills | Status: AC

## 2022-07-06 MED ORDER — INSULIN GLARGINE-YFGN 100 UNIT/ML ~~LOC~~ SOLN
28.0000 [IU] | Freq: Every day | SUBCUTANEOUS | Status: DC
  Administered 2022-07-06: 28 [IU] via SUBCUTANEOUS
  Filled 2022-07-06: qty 0.28

## 2022-07-06 MED ORDER — PEN NEEDLES 30G X 5 MM MISC: 1.0000 | Freq: Four times a day (QID) | 1 refills | Status: AC

## 2022-07-06 MED ORDER — BLOOD GLUCOSE MONITOR KIT: PACK | 0 refills | Status: AC

## 2022-07-06 NOTE — Progress Notes (Signed)
24 hour chart audit completed 

## 2022-07-06 NOTE — Plan of Care (Signed)
  Problem: Clinical Measurements: Goal: Ability to maintain clinical measurements within normal limits will improve Outcome: Progressing   

## 2022-07-06 NOTE — Discharge Summary (Signed)
Physician Discharge Summary  [name]:[id number] DOB: 1956-11-03 DOA: 07/03/2022  PCP: Biagio Borg, MD  Admit date: 07/03/2022 Discharge date: 07/06/2022  Admitted From: home Disposition:  home  Recommendations for Outpatient Follow-up:  Follow up with PCP in 1-2 weeks  Home Health: none Equipment/Devices: none  Discharge Condition: stable CODE STATUS: Full code Diet Orders (From admission, onward)     Start     Ordered   07/04/22 0958  Diet Carb Modified Fluid consistency: Thin; Room service appropriate? Yes  Diet effective now       Question Answer Comment  Diet-HS Snack? Nothing   Calorie Level Medium 1600-2000   Fluid consistency: Thin   Room service appropriate? Yes      07/04/22 0957            HPI: Per admitting MD, Micheal Guerrero is a 66 y.o. male with medical history significant for pancreatic cancer status post Whipple procedure in 2020, undergoing chemotherapy, with mets to peritoneum, who is admitted to [facility] on 07/03/2022 by way of transfer from [facility] emergency department with DKA after presenting from home to the latter facility complaining of generalized weakness. The patient reports 4 to 5 days of generalized weakness In the absence of any associated acute focal weakness, acute focal numbness, paresthesias, facial droop, slurred speech, expressive aphasia, acute change in vision, dysphagia, vertigo.  He notes that this has been associated with new onset polydipsia/polyuria.  Recent decrease in oral intake as a consequence of diminished appetite.  He notes intermittent nausea in the absence of any vomiting.  No new onset abdominal pain, diarrhea, melena, hematochezia.  No recent chest pain, shortness of breath, cough, palpitations, diaphoresis.  Denies any recent dysuria, rash, neck stiffness  Hospital Course / Discharge diagnoses: Principal Problem:   DKA (diabetic ketoacidosis) ([place]) Active Problems:    Dehydration   Hypernatremia   Generalized weakness   Hypercalcemia   AKI (acute kidney injury) ([place])   GERD (gastroesophageal reflux disease)   Principal problem DKA, new onset diabetes mellitus-patient was admitted to SDU with DKA, new DM and requiring an insulin gtt. His CBGs improved and was eventually transitioned to s.q. insulin. A1c is 8.5 which is a little bit surprising given his CBGs on admission. He was placed on a regimen of Levemir and Novolog with improvement in his SBGs. He is overall stable, fasting CBG on the day of discharge was elevated to 300 and underwent additional adjustment on his insulin regimen prior to dc. He was given a CGM prior to dc.  Active problems Acute kidney injury-prerenal, creatinine improved with hydration Hypernatremia-likely true dehydration.  Sodium improving Generalized weakness-due to #1, weakness improved with resolution of his DKA Hypercalcemia-possibly related to his malignancy.  Improved with fluid resuscitation Pancreatic cancer-outpatient follow-up with [facility], has an appointment later today.  Thrombocytopenia-likely due to his underlying malignancy. Overall stable  Sepsis ruled out   Discharge Instructions   Allergies as of 07/06/2022   No Known Allergies      Medication List     STOP taking these medications    Clenpiq 10-3.5-12 MG-GM -GM/160ML Soln Generic drug: Sod Picosulfate-Mag Ox-Cit Acd   dicyclomine 20 MG tablet Commonly known as: BENTYL   HYDROcodone-acetaminophen 5-325 MG tablet Commonly known as: NORCO/VICODIN   methocarbamol 500 MG tablet Commonly known as: ROBAXIN   prochlorperazine 10 MG tablet Commonly known as: COMPAZINE   tamsulosin 0.4 MG Caps capsule Commonly known as: FLOMAX   zolpidem 5 MG  tablet Commonly known as: AMBIEN       TAKE these medications    acetaminophen 500 MG tablet Commonly known as: TYLENOL Take 1,000 mg by mouth daily as needed (pain).   blood glucose meter kit and  supplies Kit Dispense based on patient and insurance preference. Use up to four times daily as directed.   Creon 36000 UNITS Cpep capsule Generic drug: lipase/protease/amylase Take 180,000-216,000 Units by mouth in the morning, at noon, and at bedtime.   dexamethasone 4 MG tablet Commonly known as: DECADRON Take 2-8 mg by mouth See admin instructions. Take 8 mg on Friday, Saturday, and Sunday. Take 2 mg on Monday.   famotidine 20 MG tablet Commonly known as: PEPCID Take 20 mg by mouth 2 (two) times daily.   insulin aspart 100 UNIT/ML FlexPen Commonly known as: NOVOLOG Inject 0-9 Units into the skin 3 (three) times daily with meals. CBG 121 - 150: 1 unit CBG 151 - 200: 2 units CBG 201 - 250: 3 units CBG 251 - 300: 5 units CBG 301 - 350: 7 units CBG 351 - 400: 9 units   insulin detemir 100 UNIT/ML FlexPen Commonly known as: LEVEMIR Inject 28 Units into the skin daily.   ondansetron 8 MG disintegrating tablet Commonly known as: ZOFRAN-ODT Take by mouth.   ondansetron 8 MG tablet Commonly known as: ZOFRAN Take 8 mg by mouth in the morning and at bedtime.   Pen Needles 30G X 5 MM Misc 1 each by Does not apply route 4 (four) times daily.   polyethylene glycol 17 g packet Commonly known as: MIRALAX / GLYCOLAX Take 17 g by mouth daily as needed (constipation).   potassium chloride SA 20 MEQ tablet Commonly known as: KLOR-CON M Take 20 mEq by mouth 2 (two) times daily.   senna 8.6 MG tablet Commonly known as: SENOKOT Take 1 tablet by mouth as needed for constipation.       Consultations: none  Procedures/Studies:  DG CHEST PORT 1 VIEW  Result Date: 07/04/2022 CLINICAL DATA:  Port-A-Cath placement. EXAM: PORTABLE CHEST 1 VIEW COMPARISON:  07/08/2019. FINDINGS: The heart size and mediastinal contours are within normal limits. Lung volumes are low. No consolidation, effusion, or pneumothorax. A right chest port terminates over the superior vena cava. No acute osseous  abnormality. IMPRESSION: A right chest port terminates over the superior vena cava. No acute abnormality is identified. Electronically Signed   By: Brett Fairy M.D.   On: 07/04/2022 00:41     Subjective: - no chest pain, shortness of breath, no abdominal pain, nausea or vomiting.   Discharge Exam: BP 114/77 (BP Location: Right Arm)   Pulse (!) 110   Temp (!) 97.5 F (36.4 C) (Oral)   Resp 16   Ht 5' 9"  (1.753 m)   Wt 52.8 kg   SpO2 98%   BMI 17.17 kg/m   General: Pt is alert, awake, not in acute distress Cardiovascular: RRR, S1/S2 +, no rubs, no gallops Respiratory: CTA bilaterally, no wheezing, no rhonchi Abdominal: Soft, NT, ND, bowel sounds + Extremities: no edema, no cyanosis   The results of significant diagnostics from this hospitalization (including imaging, microbiology, ancillary and laboratory) are listed below for reference.     Microbiology: Recent Results (from the past 240 hour(s))  MRSA Next Gen by PCR, Nasal     Status: None   Collection Time: 07/03/22 10:01 PM   Specimen: Nasal Mucosa; Nasal Swab  Result Value Ref Range Status   MRSA by  PCR Next Gen NOT DETECTED NOT DETECTED Final    Comment: (NOTE) The GeneXpert MRSA Assay (FDA approved for NASAL specimens only), is one component of a comprehensive MRSA colonization surveillance program. It is not intended to diagnose MRSA infection nor to guide or monitor treatment for MRSA infections. Test performance is not FDA approved in patients less than 82 years old. Performed at [facility], [place] [address] [place], [place] [address]      Labs: Basic Metabolic Panel: Recent Labs  Lab 07/03/22 2020 07/04/22 0143 07/04/22 0958 07/04/22 0959 07/04/22 1648 07/05/22 0630  NA 148* 152* 157*  --  149* 147*  K 4.5 3.8 3.6  --  4.2 3.8  CL 108 116* 122*  --  116* 113*  CO2 21* 27 29  --  26 25  GLUCOSE 596* 253* 167*  --  303* 217*  BUN 61* 51* 49*  --  43* 45*  CREATININE 1.53*  1.33* 1.22  --  1.06 1.20  CALCIUM 10.5* 9.9 9.8  --  9.1 8.9  MG  --  2.8* 2.3  --   --  1.9  PHOS  --   --   --  1.2*  --  2.8   Liver Function Tests: Recent Labs  Lab 07/03/22 1814 07/04/22 0143 07/05/22 0630  AST 14* 20 36  ALT 46* 39 36  ALKPHOS 123 97 90  BILITOT 0.5 0.3 0.6  PROT 8.4* 7.1 6.4*  ALBUMIN 4.1 2.7* 2.3*   CBC: Recent Labs  Lab 07/03/22 1814 07/03/22 1820 07/04/22 0143 07/05/22 0630  WBC 8.3  --  6.0 8.0  NEUTROABS 7.0  --  5.0 6.3  HGB 14.4 15.0 12.0* 12.7*  HCT 45.0 44.0 36.6* 39.1  MCV 88.6  --  88.4 88.5  PLT 340  --  209 139*   CBG: Recent Labs  Lab 07/05/22 1212 07/05/22 1559 07/05/22 1655 07/05/22 2219 07/06/22 0648  GLUCAP 364* 269* 242* 231* 303*   Hgb A1c Recent Labs    07/03/22 1819  HGBA1C 8.5*   Lipid Profile No results for input(s): "CHOL", "HDL", "LDLCALC", "TRIG", "CHOLHDL", "LDLDIRECT" in the last 72 hours. Thyroid function studies Recent Labs    07/04/22 0958  TSH 1.435   Urinalysis    Component Value Date/Time   COLORURINE YELLOW 07/03/2022 2020   APPEARANCEUR HAZY (A) 07/03/2022 2020   LABSPEC 1.027 07/03/2022 2020   PHURINE 5.0 07/03/2022 2020   GLUCOSEU >1,000 (A) 07/03/2022 2020   GLUCOSEU NEGATIVE 02/28/2021 1559   HGBUR LARGE (A) 07/03/2022 2020   BILIRUBINUR NEGATIVE 07/03/2022 2020   KETONESUR 15 (A) 07/03/2022 2020   PROTEINUR 30 (A) 07/03/2022 2020   UROBILINOGEN 0.2 02/28/2021 1559   NITRITE NEGATIVE 07/03/2022 2020   LEUKOCYTESUR NEGATIVE 07/03/2022 2020    FURTHER DISCHARGE INSTRUCTIONS:   Get Medicines reviewed and adjusted: Please take all your medications with you for your next visit with your Primary MD   Laboratory/radiological data: Please request your Primary MD to go over all hospital tests and procedure/radiological results at the follow up, please ask your Primary MD to get all Hospital records sent to his/her office.   In some cases, they will be blood work, cultures and  biopsy results pending at the time of your discharge. Please request that your primary care M.D. goes through all the records of your hospital data and follows up on these results.   Also Note the following: If you experience worsening of your admission symptoms,  develop shortness of breath, life threatening emergency, suicidal or homicidal thoughts you must seek medical attention immediately by calling 911 or calling your MD immediately  if symptoms less severe.   You must read complete instructions/literature along with all the possible adverse reactions/side effects for all the Medicines you take and that have been prescribed to you. Take any new Medicines after you have completely understood and accpet all the possible adverse reactions/side effects.    Do not drive when taking Pain medications or sleeping medications (Benzodaizepines)   Do not take more than prescribed Pain, Sleep and Anxiety Medications. It is not advisable to combine anxiety,sleep and pain medications without talking with your primary care practitioner   Special Instructions: If you have smoked or chewed Tobacco  in the last 2 yrs please stop smoking, stop any regular Alcohol  and or any Recreational drug use.   Wear Seat belts while driving.   Please note: You were cared for by a hospitalist during your hospital stay. Once you are discharged, your primary care physician will handle any further medical issues. Please note that NO REFILLS for any discharge medications will be authorized once you are discharged, as it is imperative that you return to your primary care physician (or establish a relationship with a primary care physician if you do not have one) for your post hospital discharge needs so that they can reassess your need for medications and monitor your lab values.  Time coordinating discharge: 35 minutes  SIGNED:  Marzetta Board, MD, PhD 07/06/2022, 7:05 AM

## 2022-07-07 ENCOUNTER — Telehealth: Payer: Self-pay

## 2022-07-07 NOTE — Telephone Encounter (Signed)
Transition Care Management Unsuccessful Follow-up Telephone Call  Date of discharge and from where:  07/06/2022  [facility]   Attempts:  1st Attempt  Reason for unsuccessful TCM follow-up call:  Unable to leave message

## 2022-07-10 NOTE — Telephone Encounter (Signed)
Transition Care Management Unsuccessful Follow-up Telephone Call  Date of discharge and from where:  07/05/2022  [facility]   Attempts:  2nd Attempt  Reason for unsuccessful TCM follow-up call:  No answer/busy

## 2022-07-17 ENCOUNTER — Telehealth: Payer: Self-pay | Admitting: Internal Medicine

## 2022-07-17 NOTE — Telephone Encounter (Signed)
[place] from [facility] called and states pt entered hospice on 2023-02-24 and passed away on 02/25/2023.  Family wanted Dr. Jenny Reichmann to be the attending MD but were unable to contact us over the weekend. [facility] is asking Dr. Jenny Reichmann to confirm the terminal illness the pt was suffering form.   For any questions please call:  [place]: [phone]

## 2022-07-18 NOTE — Telephone Encounter (Signed)
Yes the terminal illness was metastatic pancreatic cancer  I already signed the death certificate

## 2022-07-19 NOTE — Telephone Encounter (Signed)
Notified [facility] was in not in. Spoke to [place] gave MD response.Marland KitchenJohny Chess

## 2022-08-04 DEATH — deceased

## 2023-08-03 IMAGING — CT CT ABD-PELV W/ CM
2 of 5 series · 14 of 46 positions shown, 16 images · IV contrast (agent unspecified)
Comparison: 03/30/2021

CLINICAL DATA: Bowel obstruction suspected, lower abdominal
cramping, decreased colostomy output, history of metastatic
pancreatic cancer, status post Whipple procedure, descending colon
obstruction secondary to metastasis pending chemotherapy * Tracking
Code: BO *

EXAM:
CT ABDOMEN AND PELVIS WITH CONTRAST
TECHNIQUE: Multidetector CT imaging of the abdomen and pelvis was performed
using the standard protocol following bolus administration of
intravenous contrast.

[Series 2: abd pel w · axial · 0.61mm/px · z∈[+697,+1122]mm · 11 of 95 slices shown, 13 images]
[im 5/95  soft-tissue]
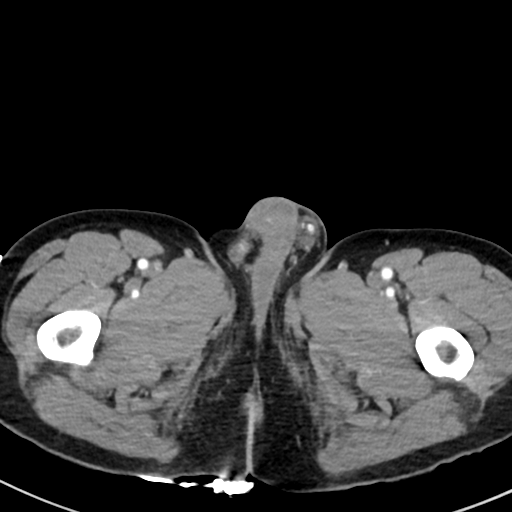
[im 5/95  bone]
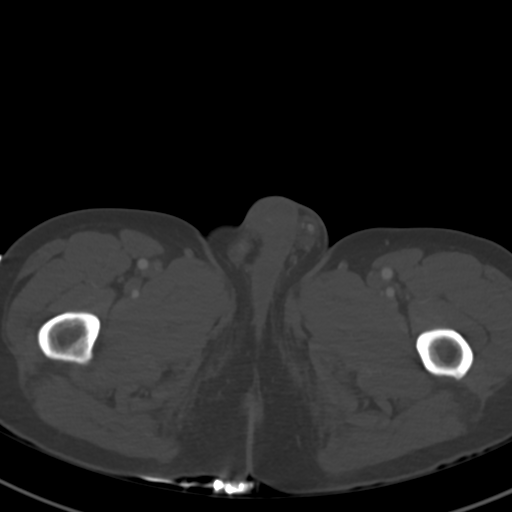
[im 15/95  soft-tissue]
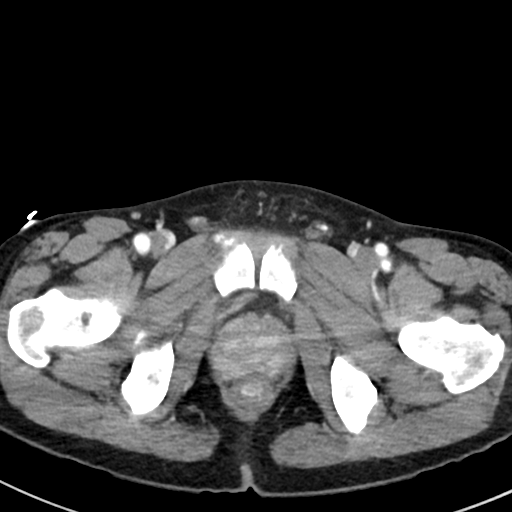
[im 24/95  soft-tissue]
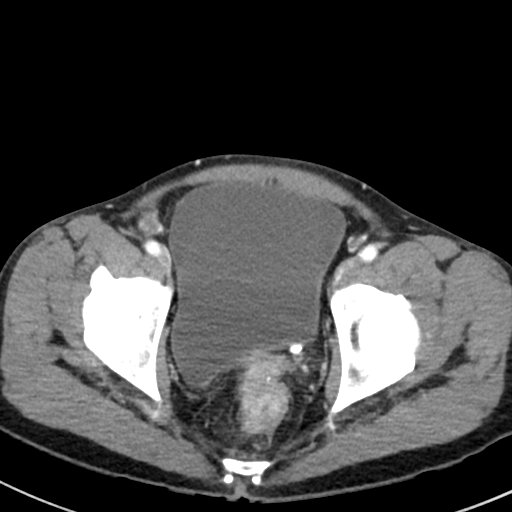
[im 33/95  soft-tissue]
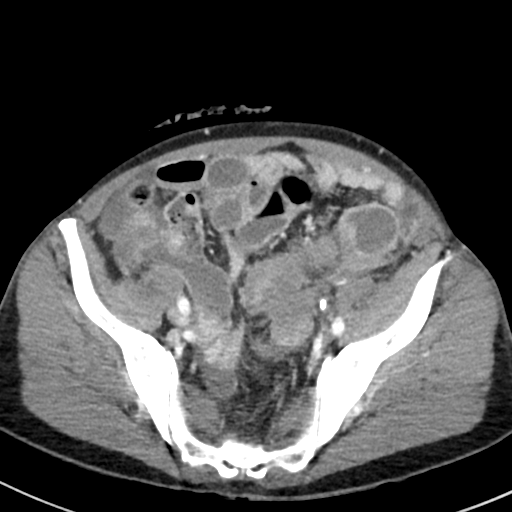
[im 38/95  soft-tissue]
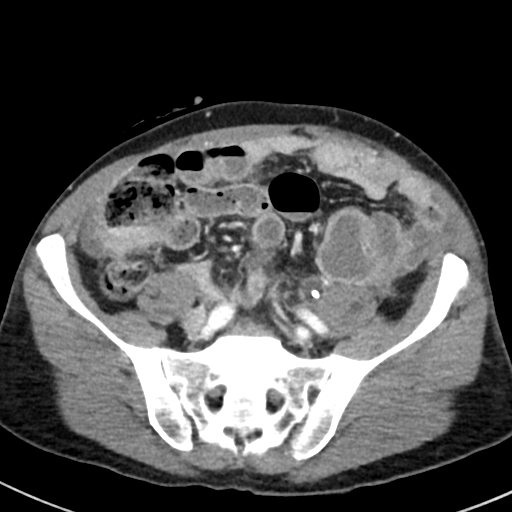
[im 48/95  soft-tissue]
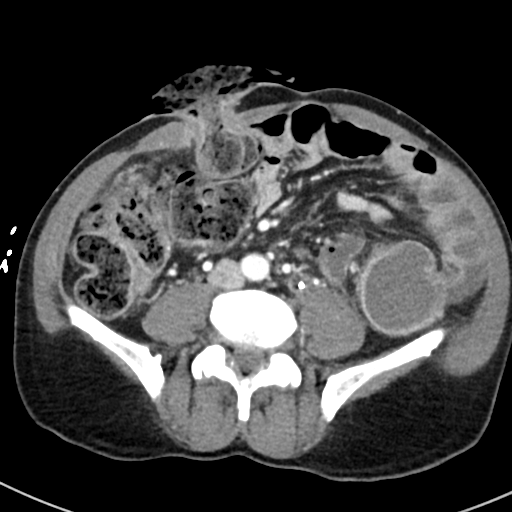
[im 57/95  soft-tissue]
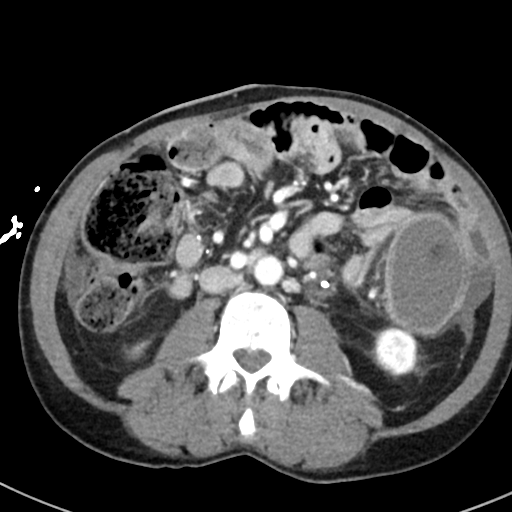
[im 62/95  soft-tissue]
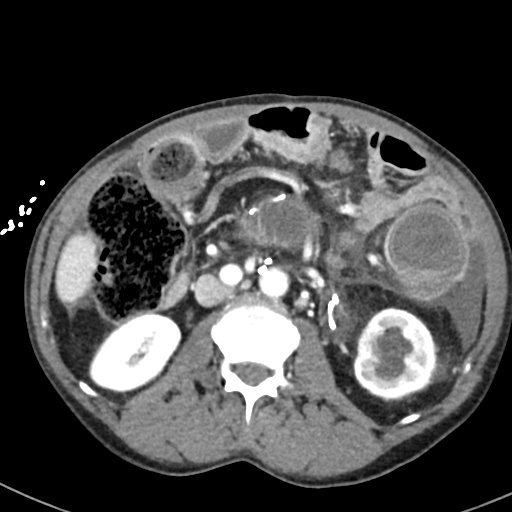
[im 71/95  soft-tissue]
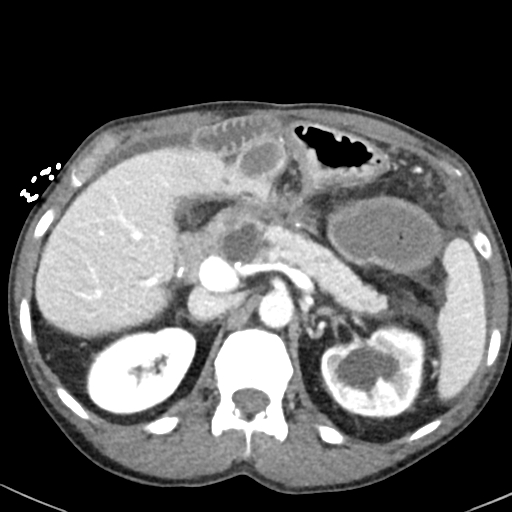
[im 71/95  bone]
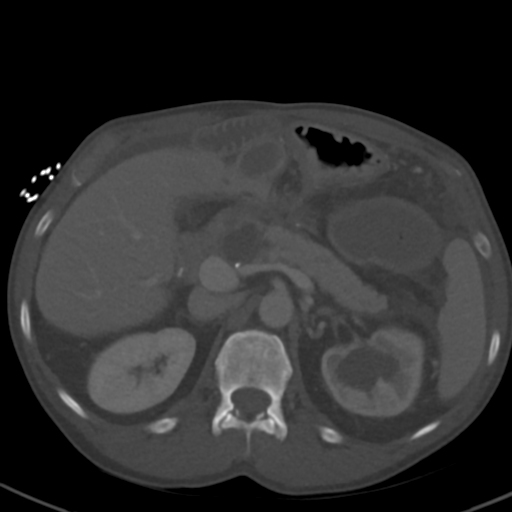
[im 80/95  soft-tissue]
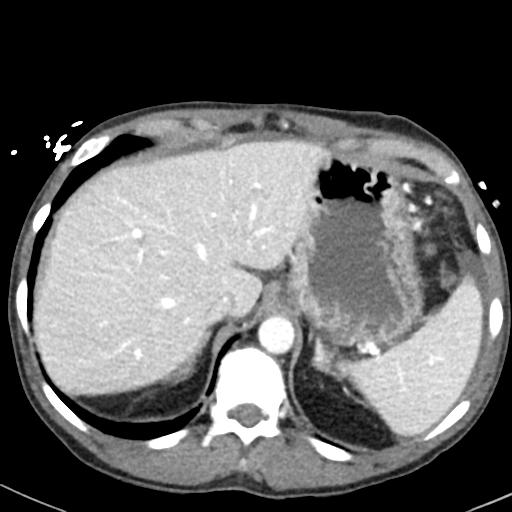
[im 90/95  soft-tissue]
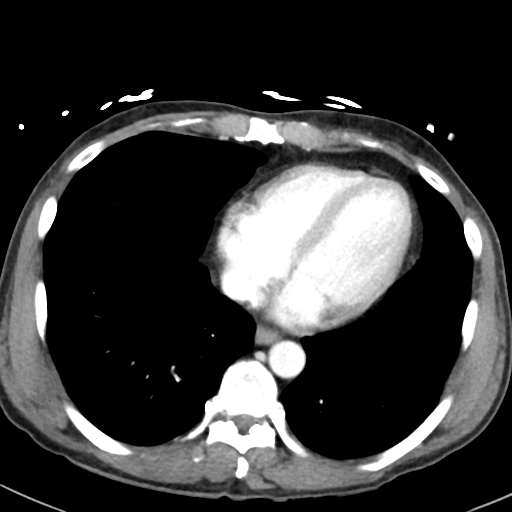

[Series 5: coronal · coronal · 0.63mm/px · 3 of 90 slices shown]
[im 30/90  soft-tissue]
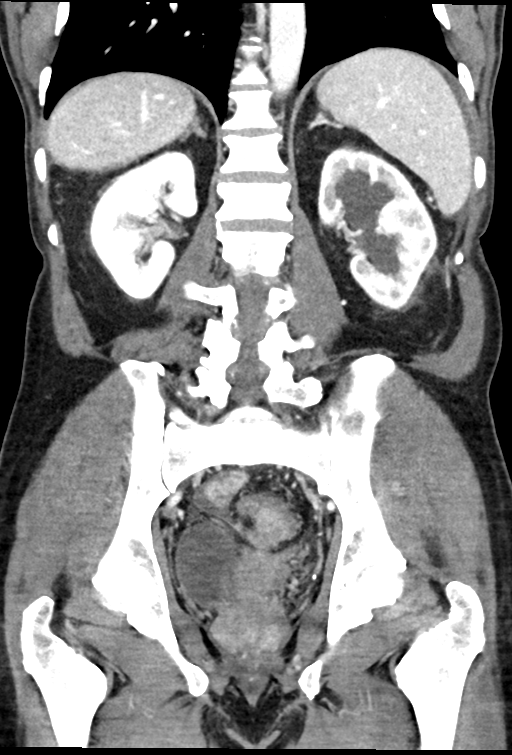
[im 40/90  soft-tissue]
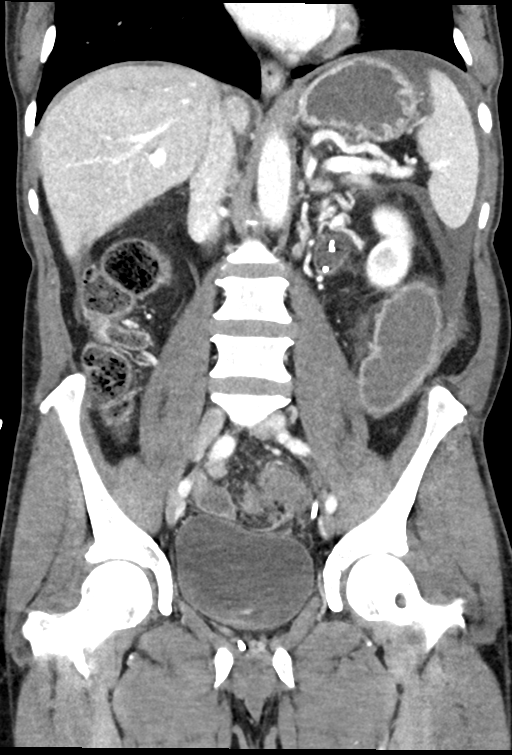
[im 50/90  soft-tissue]
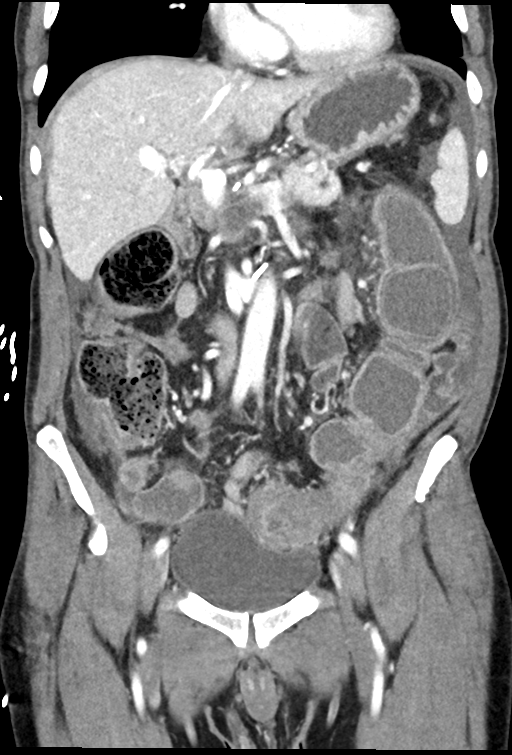

[14 of 46 positions shown; findings below may reference images not displayed]

RADIATION DOSE REDUCTION: This exam was performed according to the
departmental dose-optimization program which includes automated
exposure control, adjustment of the mA and/or kV according to
patient size and/or use of iterative reconstruction technique.

CONTRAST:  100mL OMNIPAQUE IOHEXOL 300 MG/ML  SOLN
FINDINGS: Lower chest: No acute abnormality.

Hepatobiliary: No focal liver abnormality is seen. Status post
cholecystectomy and hepatojejunostomy. No biliary dilatation.

Pancreas: Status post Whipple pancreaticoduodenectomy. Dilatation of
the main pancreatic duct measuring up to 0.7 cm (series 2, image
24).

Spleen: Normal in size without significant abnormality.

Adrenals/Urinary Tract: Adrenal glands are unremarkable. Severe left
hydronephrosis and hydroureter to the distal third of the ureter,
with delayed nephrogram and double-J ureteral stent catheter, formed
pigtail in a low lying position near the ureteropelvic junction, and
within the urinary bladder. No right-sided hydronephrosis. Bladder
is unremarkable.

Stomach/Bowel: Status post distal gastrectomy and gastrojejunostomy.
Diverting transverse colostomy in the right hemiabdomen (series 2,
image 44). The distal transverse colon and proximal sigmoid colon
are fluid-filled and mildly distended, with severe, masslike long
segment thickening of the sigmoid colon and rectum (series 2, image
65, series 5, image 21). Suspect an exophytic mass within the distal
sigmoid mesocolon near the rectosigmoid junction, which appears to
obstruct the distal left ureter, measuring 3.8 x 2.9 cm (series 2,
image 63).

Vascular/Lymphatic: No significant vascular findings are present. No
enlarged abdominal or pelvic lymph nodes.

Reproductive: No mass or other significant abnormality.

Other: No abdominal wall hernia or abnormality. Small volume ascites
throughout the abdomen and pelvis. Stranding and nodularity along
the peritoneal surfaces and in the left upper quadrant (series 2,
image 15).

Musculoskeletal: No acute or significant osseous findings.
IMPRESSION: 1. The distal transverse colon and proximal sigmoid colon are
fluid-filled and mildly distended, with severe, masslike long
segment thickening of the sigmoid colon and rectum. Suspect an
exophytic mass within the distal sigmoid mesocolon near the
rectosigmoid junction, which appears to obstruct the distal left
ureter. Although somewhat unusual as a site of metastasis this is
generally in keeping with reported, known pancreatic cancer
metastasis complicated by bowel and ureteral obstruction.
2. Severe left hydronephrosis and hydroureter to the distal third of
the ureter, with delayed nephrogram and double-J ureteral stent
catheter, formed pigtail in a low lying position near the
ureteropelvic junction, and within the urinary bladder. No
right-sided hydronephrosis.
3. Status post diverting transverse colostomy. The distal transverse
and descending colon are fluid-filled proximal to the site of
sigmoid obstipation.
4. Small volume ascites throughout the abdomen and pelvis with
stranding and nodularity in the left upper quadrant, in keeping with
peritoneal metastatic disease.
5. Status post Whipple pancreaticoduodenectomy. Presumably
postoperative dilatation of the main pancreatic duct measuring up to
0.7 cm.
